# Patient Record
Sex: Female | Born: 1985 | Race: White | Hispanic: No | Smoking: Former smoker
Health system: Southern US, Community
[De-identification: ages and names within clinical notes are randomized; demographics above are authoritative.]

## PROBLEM LIST (undated history)

## (undated) DIAGNOSIS — T7840XA Allergy, unspecified, initial encounter: Secondary | ICD-10-CM

## (undated) DIAGNOSIS — G4700 Insomnia, unspecified: Secondary | ICD-10-CM

## (undated) DIAGNOSIS — F411 Generalized anxiety disorder: Secondary | ICD-10-CM

## (undated) DIAGNOSIS — J45909 Unspecified asthma, uncomplicated: Secondary | ICD-10-CM

## (undated) DIAGNOSIS — F419 Anxiety disorder, unspecified: Secondary | ICD-10-CM

## (undated) DIAGNOSIS — Z8659 Personal history of other mental and behavioral disorders: Secondary | ICD-10-CM

## (undated) DIAGNOSIS — H04129 Dry eye syndrome of unspecified lacrimal gland: Secondary | ICD-10-CM

## (undated) DIAGNOSIS — L940 Localized scleroderma [morphea]: Secondary | ICD-10-CM

## (undated) DIAGNOSIS — Z973 Presence of spectacles and contact lenses: Secondary | ICD-10-CM

## (undated) DIAGNOSIS — Z8709 Personal history of other diseases of the respiratory system: Secondary | ICD-10-CM

## (undated) DIAGNOSIS — J302 Other seasonal allergic rhinitis: Secondary | ICD-10-CM

## (undated) DIAGNOSIS — B009 Herpesviral infection, unspecified: Secondary | ICD-10-CM

## (undated) DIAGNOSIS — Z309 Encounter for contraceptive management, unspecified: Secondary | ICD-10-CM

## (undated) HISTORY — DX: Insomnia, unspecified: G47.00

## (undated) HISTORY — DX: Herpesviral infection, unspecified: B00.9

## (undated) HISTORY — DX: Encounter for contraceptive management, unspecified: Z30.9

## (undated) HISTORY — DX: Unspecified asthma, uncomplicated: J45.909

## (undated) HISTORY — PX: NASAL SINUS SURGERY: SHX719

## (undated) HISTORY — DX: Allergy, unspecified, initial encounter: T78.40XA

---
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
## 2008-12-29 ENCOUNTER — Other Ambulatory Visit: Admission: RE | Admit: 2008-12-29 | Discharge: 2008-12-29 | Payer: Self-pay | Admitting: Obstetrics and Gynecology

## 2010-02-07 ENCOUNTER — Emergency Department (HOSPITAL_COMMUNITY)
Admission: EM | Admit: 2010-02-07 | Discharge: 2010-02-07 | Payer: Self-pay | Source: Home / Self Care | Admitting: Emergency Medicine

## 2010-02-09 ENCOUNTER — Ambulatory Visit (HOSPITAL_COMMUNITY)
Admission: RE | Admit: 2010-02-09 | Discharge: 2010-02-09 | Payer: Self-pay | Source: Home / Self Care | Admitting: Family Medicine

## 2010-04-01 ENCOUNTER — Encounter: Payer: Self-pay | Admitting: Internal Medicine

## 2010-04-19 ENCOUNTER — Other Ambulatory Visit: Payer: Self-pay | Admitting: Adult Health

## 2010-04-19 ENCOUNTER — Other Ambulatory Visit (HOSPITAL_COMMUNITY)
Admission: RE | Admit: 2010-04-19 | Discharge: 2010-04-19 | Disposition: A | Discharge status: Home / Self Care | Payer: BC Managed Care – PPO | Source: Ambulatory Visit | Attending: Obstetrics and Gynecology | Admitting: Obstetrics and Gynecology

## 2010-04-19 DIAGNOSIS — Z113 Encounter for screening for infections with a predominantly sexual mode of transmission: Secondary | ICD-10-CM | POA: Insufficient documentation

## 2010-04-19 DIAGNOSIS — Z01419 Encounter for gynecological examination (general) (routine) without abnormal findings: Secondary | ICD-10-CM | POA: Insufficient documentation

## 2010-05-22 LAB — CBC
HCT: 38 % (ref 36.0–46.0)
Hemoglobin: 13.9 g/dL (ref 12.0–15.0)
MCH: 32.3 pg (ref 26.0–34.0)
MCHC: 36.6 g/dL — ABNORMAL HIGH (ref 30.0–36.0)
Platelets: 271 10*3/uL (ref 150–400)
RBC: 4.3 MIL/uL (ref 3.87–5.11)
RDW: 11.6 % (ref 11.5–15.5)

## 2010-05-22 LAB — BASIC METABOLIC PANEL
Calcium: 9.3 mg/dL (ref 8.4–10.5)
GFR calc Af Amer: >60 mL/min (ref >60)
GFR calc non Af Amer: >60 mL/min (ref >60)

## 2010-05-22 LAB — DIFFERENTIAL
Basophils Relative: 0 % (ref 0–1)
Eosinophils Relative: 1 % (ref 0–5)
Lymphocytes Relative: 17 % (ref 12–46)
Monocytes Absolute: 0.4 10*3/uL (ref 0.1–1.0)
Monocytes Relative: 5 % (ref 3–12)
Neutro Abs: 6.6 10*3/uL (ref 1.7–7.7)

## 2010-07-24 NOTE — Assessment & Plan Note (Signed)
[facility]                                 ON-CALL NOTE   NAME:YOUNGAngelys, Yetman                         MRN:          [id number]  DATE:11/23/2006                            DOB:          Jul 14, 1985    ON CALL NOTE   DATE OF CALL:  November 23, 2006, time 9:30 A.M.   PROBLEM:  The patient's father, Sibley Rolison, phone number [phone],  called to report that Dhalia had a recent upper respiratory tract  infection, started on a Z-Pak and steroid taper in the middle of the  past week.  Cough getting worse, barking, not really wheezy.  No phlegm,  no fever.  The patient is insisting on going to work and father is  seeking reassurance.  She had dropped off Advair while on her steroid  taper.   IMPRESSION:  Upper respiratory tract infection with bronchitis and  possibly some asthma.   PLAN:  1. Restart Advair which had been on hold, one puff b.i.d.  2. Finish Medrol taper.  3. Fluids and rest.  4. Call office for work-in evaluation if she fails to clear in the      next day or two, otherwise keep scheduled appointment.     Clinton D. Maple Hudson, MD, Tonny Bollman, FACP  Electronically Signed    CDY/MedQ  DD: 11/24/2006  DT: 11/25/2006  Job #: [id number]

## 2010-07-24 NOTE — Assessment & Plan Note (Signed)
[place] [facility]                             PULMONARY OFFICE NOTE   NAME:Pruitt, Heather FAXON                         MRN:          [id number]  DATE:11/24/2006                            DOB:          Oct 29, 1985    HISTORY OF PRESENT ILLNESS:  The patient is a 25 year old female patient  of Dr.  Roxy Cedar  who has a history of allergic asthma that was seen for  pulmonary consult on May 1 of this year. The patient had previously been  followed at [facility] pediatric pulmonary group. The patient presents  today for an acute office visit. The patient complains over the last  week that she has had nasal congestion, post-nasal drip, minimally  productive cough and wheezing. The patient had been called in a Z-Pak  and Medrol dose pack which she will finish tomorrow. The symptoms have  improved. The patient does not use her albuterol recently due to  associated palpitations. However, complains that she has had some  tightness. The patient also had previously been on Advair and it is  unclear of the exact dose that she was taking. However, had recently  stopped this according to her father. There had been some concern over  side effect profile of Advair. Also, it is actually unclear exactly what  medication she is taking because last visit the patient had been  recommended to take Intal plus Singulair which she is currently not on.  The patient denies any hemoptysis, orthopnea or PND or leg swelling. The  patient does not do routine peak flows. The patient's records are not  available from [facility] at this time.   PAST MEDICAL HISTORY:  Reviewed.   CURRENT MEDICATIONS:  Reviewed.   PHYSICAL EXAMINATION:  The patient is pleasant well-developed, well-  nourished female in no acute distress. She is afebrile with stable vital  signs. O2 saturation is 99% on room air.  HEENT: Nasal mucosa is slightly pale. Nontender sinuses. Conjunctivae  were not injected. Tympanic  membranes are normal. The patient's  oropharynx is clear.  NECK: Supple without cervical adenopathy. No JVD.  LUNGS: Lung sounds are clear to auscultation bilaterally without any  wheezing or crackles. diminished in the bases, otherwise, clear.  CARDIAC: S1, S2 without murmur, rub or gallop.  ABDOMEN: Soft, nontender and no palpable hepatosplenomegaly.  EXTREMITIES: Warm without edema.   DATA REVIEWED:  In office spirometry revealed an FEV1 of 4.01 liters  which is 119% of the predicted.   IMPRESSION/PLAN:  Acute upper respiratory infection with a mild  asthmatic flare. The patient appears to be improved at this time. No  further antibiotics or steroids appear to be needed. The patient was  offered a Xopenex nebulizer treatment. However, she declined at this  time. She has been recommended to use Mucinex twice daily as needed and  use Claritin for any post-nasal drip symptoms. I have recommended that  the patient has restarted Advair 250/50 and she may maintain on this. We  will recheck her in one month with Dr.  Maple Hudson at that time and will need  a  full set of PFTs to establish her lung function baseline. Also, the  patient's father has agreed to get the patient's records from [facility].      Rubye Oaks, NP  Electronically Signed      Clinton D. Maple Hudson, MD, Tonny Bollman, FACP  Electronically Signed   TP/MedQ  DD: 11/24/2006  DT: 11/24/2006  Job #: [id number]

## 2010-07-26 ENCOUNTER — Other Ambulatory Visit: Payer: Self-pay | Admitting: Obstetrics & Gynecology

## 2010-07-26 DIAGNOSIS — N63 Unspecified lump in unspecified breast: Secondary | ICD-10-CM

## 2010-07-27 NOTE — Assessment & Plan Note (Signed)
[place] [facility]                             PULMONARY OFFICE NOTE   NAME:Heather Pruitt, Heather Pruitt                         MRN:          [id number]  DATE:07/10/2006                            DOB:          1986/01/30    PROBLEM:  A 25 year old self-referred and seen with mother to establish  for management of allergic asthma.   HISTORY OF PRESENT ILLNESS:  She had been followed as a child through  the pediatric pulmonary group at [facility] but has aged out. She has had  asthma since [place] childhood. Never hospitalized. Has tried Singulair.  They describe mostly a dry, croupy cough and apparently, this is  considered to be cough equivalent asthma. Mother is concerned about the  safety of Advair 250/50 versus 100/50 and we had a long talk about these  and available medications, inhaled steroids, and long acting beta  adrenergic drugs.   MEDICATIONS:  1. Advair 250/50 and also has 100/50.  2. Claritin use p.r.n.  3. Albuterol rescue inhaler, rarely needed.  4. Albuterol 2 mg tabs, using 1/2 b.i.d. p.r.n.   ALLERGIES:  NO KNOWN DRUG ALLERGIES.   REVIEW OF SYSTEMS:  Occasional episodes of cough with little congestion  or wheeze, headaches, itching and sneezing. She had mononucleosis in  February and a gastroenteritis attributed to food poisoning in November  of 2007, which they say caused some weight loss. She does not seem to  recognize heartburn, palpitations, bloody or purulent sputum, fevers, or  sweats.   PAST MEDICAL HISTORY:  Wisdom teeth extraction, sinus surgery, which may  have been a septoplasty in 1998. Asthma. No history with problems with  ears or any rashes, food allergy, or unusual sting reactions. The last  prednisone was almost 2 years ago. She has not been exposed to aspirin  and denies problems with Latex or contrast Iodine.   SOCIAL HISTORY:  Never smoked. Single. Working part-time as a Development worker, international aid. Living with parents. Going to school at  [employer].   ENVIRONMENTAL:  They live in an older house with central air  conditioning, electric baseboard heat, window unit air conditioning. No  feathers. No indoor pets. No significant mold or dust problem  recognized.   FAMILY HISTORY:  Allergies and asthma in the parents. Heart disease on  mother's side.   OBJECTIVE:  VITAL SIGNS:  Weight 131 pounds. Blood pressure 100/52,  pulse 81, room air saturation 100%.  LUNGS:  Currently clear without cough.  HEENT:  Nose and throat clear.  HEART:  Sounds regular without murmur or gallop.  EXTREMITIES:  No clubbing, cyanosis, or edema.   IMPRESSION:  Mild cough equivalent asthma. Usually Advair 100 is  sufficient but occasional she needs to move up to the 250/50 with very  rare need for albuterol rescue inhaler. Mother's main concern was about  medication safety. Educational discussion was done as above.   PLAN:  Now in the summer, try without Advair. Instead, try Intal 2 puffs  q.i.d. plus Singulair 10 mg. These medications were discussed. Schedule  return in 2 months,  earlier p.r.n.     Clinton D. Maple Hudson, MD, Tonny Bollman, FACP  Electronically Signed    CDY/MedQ  DD: 07/12/2006  DT: 07/12/2006  Job #: [id number]

## 2010-08-01 ENCOUNTER — Ambulatory Visit (HOSPITAL_COMMUNITY)
Admission: RE | Admit: 2010-08-01 | Discharge: 2010-08-01 | Disposition: A | Discharge status: Home / Self Care | Payer: BC Managed Care – PPO | Source: Ambulatory Visit | Attending: Obstetrics & Gynecology | Admitting: Obstetrics & Gynecology

## 2010-08-01 DIAGNOSIS — N63 Unspecified lump in unspecified breast: Secondary | ICD-10-CM

## 2011-04-19 ENCOUNTER — Other Ambulatory Visit (HOSPITAL_COMMUNITY)
Admission: RE | Admit: 2011-04-19 | Discharge: 2011-04-19 | Disposition: A | Discharge status: Home / Self Care | Payer: BC Managed Care – PPO | Source: Ambulatory Visit | Attending: Obstetrics and Gynecology | Admitting: Obstetrics and Gynecology

## 2011-04-19 ENCOUNTER — Other Ambulatory Visit: Payer: Self-pay | Admitting: Adult Health

## 2011-04-19 DIAGNOSIS — Z01419 Encounter for gynecological examination (general) (routine) without abnormal findings: Secondary | ICD-10-CM | POA: Insufficient documentation

## 2011-06-29 ENCOUNTER — Encounter (HOSPITAL_COMMUNITY): Payer: Self-pay | Admitting: *Deleted

## 2011-06-29 ENCOUNTER — Emergency Department (HOSPITAL_COMMUNITY)
Admission: EM | Admit: 2011-06-29 | Discharge: 2011-06-29 | Disposition: A | Discharge status: Home / Self Care | Payer: BC Managed Care – PPO | Attending: Emergency Medicine | Admitting: Emergency Medicine

## 2011-06-29 DIAGNOSIS — F411 Generalized anxiety disorder: Secondary | ICD-10-CM | POA: Insufficient documentation

## 2011-06-29 DIAGNOSIS — R112 Nausea with vomiting, unspecified: Secondary | ICD-10-CM | POA: Insufficient documentation

## 2011-06-29 DIAGNOSIS — K529 Noninfective gastroenteritis and colitis, unspecified: Secondary | ICD-10-CM

## 2011-06-29 DIAGNOSIS — K5289 Other specified noninfective gastroenteritis and colitis: Secondary | ICD-10-CM | POA: Insufficient documentation

## 2011-06-29 DIAGNOSIS — Z79899 Other long term (current) drug therapy: Secondary | ICD-10-CM | POA: Insufficient documentation

## 2011-06-29 DIAGNOSIS — R109 Unspecified abdominal pain: Secondary | ICD-10-CM | POA: Insufficient documentation

## 2011-06-29 HISTORY — DX: Anxiety disorder, unspecified: F41.9

## 2011-06-29 MED ORDER — ONDANSETRON HCL 4 MG/2ML IJ SOLN: 4.0000 mg | Freq: Once | INTRAMUSCULAR | Status: DC

## 2011-06-29 MED ORDER — OXYCODONE-ACETAMINOPHEN 5-325 MG PO TABS: 1.0000 | ORAL_TABLET | Freq: Four times a day (QID) | ORAL | Status: AC | PRN

## 2011-06-29 MED ORDER — ONDANSETRON HCL 8 MG PO TABS: 8.0000 mg | ORAL_TABLET | ORAL | Status: AC | PRN

## 2011-06-29 MED ORDER — SODIUM CHLORIDE 0.9 % IV BOLUS (SEPSIS): 1000.0000 mL | Freq: Once | INTRAVENOUS | Status: DC

## 2011-06-29 MED ORDER — SODIUM CHLORIDE 0.9 % IV SOLN
Freq: Once | INTRAVENOUS | Status: AC
  Administered 2011-06-29: 16:00:00 via INTRAVENOUS

## 2011-06-29 MED ORDER — ONDANSETRON HCL 4 MG/2ML IJ SOLN
4.0000 mg | Freq: Once | INTRAMUSCULAR | Status: AC
  Administered 2011-06-29: 4 mg via INTRAVENOUS
  Filled 2011-06-29: qty 2

## 2011-06-29 MED ORDER — SODIUM CHLORIDE 0.9 % IV BOLUS (SEPSIS)
1000.0000 mL | Freq: Once | INTRAVENOUS | Status: AC
  Administered 2011-06-29: 1000 mL via INTRAVENOUS

## 2011-06-29 NOTE — ED Notes (Signed)
Pt stated she was feeling much better after first liter of fluids.  Family at bedside.

## 2011-06-29 NOTE — Discharge Instructions (Signed)
Clear liquids tonight. Medications for pain and nausea. Return if worse

## 2011-06-29 NOTE — ED Notes (Signed)
Pt presents with N/V/D and stomach cramps x 2 days. Pt is pallor in color at this time.

## 2011-06-29 NOTE — ED Provider Notes (Signed)
History     CSN: [id number]  Arrival date & time 06/29/11  1407   First MD Initiated Contact with Patient 06/29/11 1512      Chief Complaint  Patient presents with  . Emesis  . Abdominal Pain    (Consider location/radiation/quality/duration/timing/severity/associated sxs/prior treatment) HPI...nausea, vomiting, diarrhea, for 24 hours. Also complains of abdominal cramping. She was exposed to some sick children. No fever, chills, dysuria. Feels dehydrated. Nothing makes it better or worse. Symptoms are moderate.  Past Medical History  Diagnosis Date  . Anxiety     Past Surgical History  Procedure Date  . Nasal sinus surgery     No family history on file.  History  Substance Use Topics  . Smoking status: Former Smoker    Types: Cigarettes  . Smokeless tobacco: Not on file  . Alcohol Use: Yes     occassionally    OB History    Grav Para Term Preterm Abortions TAB SAB Ect Mult Living                  Review of Systems  All other systems reviewed and are negative.    Allergies  Review of patient's allergies indicates no known allergies.  Home Medications   Current Outpatient Rx  Name Route Sig Dispense Refill  . VITAMIN D 2000 UNITS PO TABS Oral Take 2,000 Units by mouth daily.    [name] ONE-DAILY MULTI VITAMINS PO TABS Oral Take 1 tablet by mouth daily.    Azzie Roup ACE-ETH ESTRAD-FE 1.5-30 MG-MCG PO TABS Oral Take 1 tablet by mouth daily.    . VENLAFAXINE HCL ER 75 MG PO CP24 Oral Take 75 mg by mouth daily.    [name] ONDANSETRON HCL 8 MG PO TABS Oral Take 1 tablet (8 mg total) by mouth every 4 (four) hours as needed for nausea. 8 tablet 0  . OXYCODONE-ACETAMINOPHEN 5-325 MG PO TABS Oral Take 1-2 tablets by mouth every 6 (six) hours as needed for pain. 15 tablet 0    BP 101/57  Pulse 98  Temp(Src) 98.1 F (36.7 C) (Oral)  Resp 18  Ht 5\' 4"  (1.626 m)  Wt 138 lb (62.596 kg)  BMI 23.69 kg/m2  SpO2 100%  LMP 05/29/2011  Physical Exam  Nursing note and vitals  reviewed. Constitutional: She is oriented to person, place, and time. She appears well-developed and well-nourished.  HENT:  Head: Normocephalic and atraumatic.  Eyes: Conjunctivae and EOM are normal. Pupils are equal, round, and reactive to light.  Neck: Normal range of motion. Neck supple.  Cardiovascular: Normal rate and regular rhythm.   Pulmonary/Chest: Effort normal and breath sounds normal.  Abdominal: Soft. Bowel sounds are normal.  Musculoskeletal: Normal range of motion.  Neurological: She is alert and oriented to person, place, and time.  Skin: Skin is warm and dry.  Psychiatric: She has a normal mood and affect.    ED Course  Procedures (including critical care time)  Labs Reviewed - No data to display No results found.   1. Gastroenteritis       MDM  IV hydration, Zofran.  Recheck at 1715: Feeling much better. Good color. No acute abdomen      Donnetta Hutching, MD 06/29/11 1727

## 2011-06-29 NOTE — ED Notes (Signed)
Pt reports vomiting x1, 2 days ago, has since been having nausea and ab cramping.   Color is pale.  +diarrhea. No fever

## 2012-01-15 IMAGING — CR DG CERVICAL SPINE COMPLETE 4+V
6 series · 6 of 6 positions shown · non-contrast
Comparison: None.

CLINICAL DATA: Syncope

CERVICAL SPINE - COMPLETE 4+ VIEW

[view not recorded (1 of 6)]
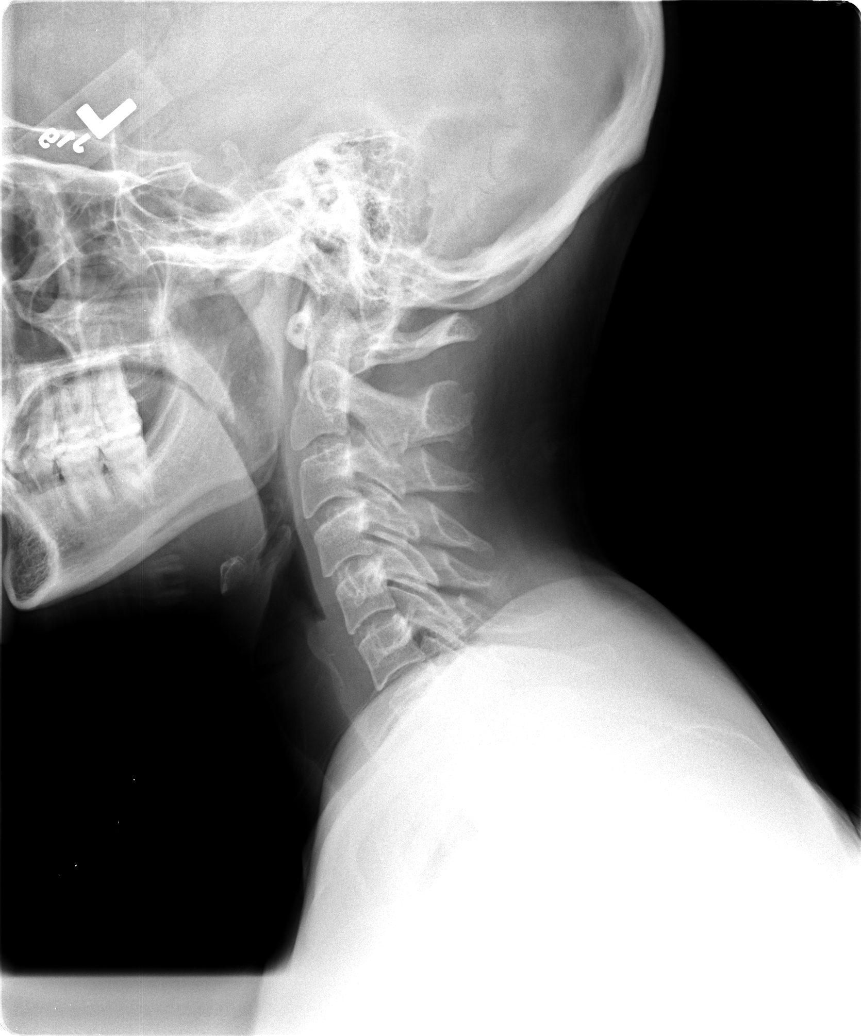

[view not recorded (2 of 6)]
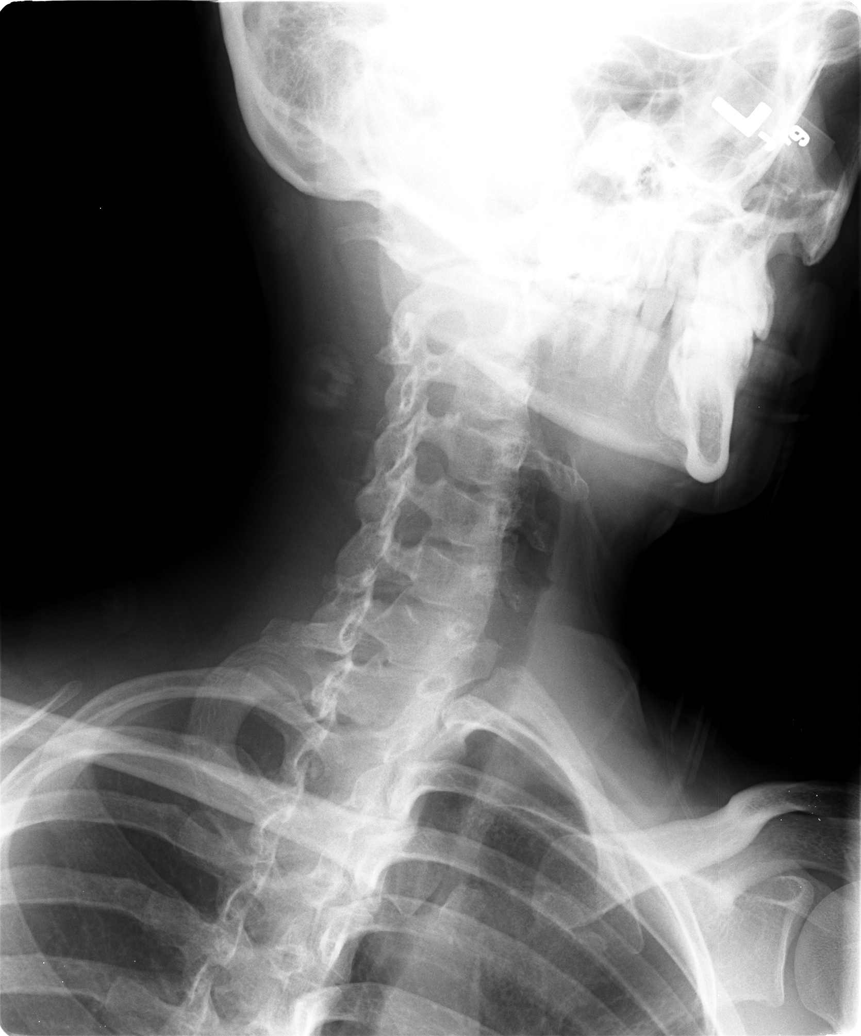

[view not recorded (3 of 6)]
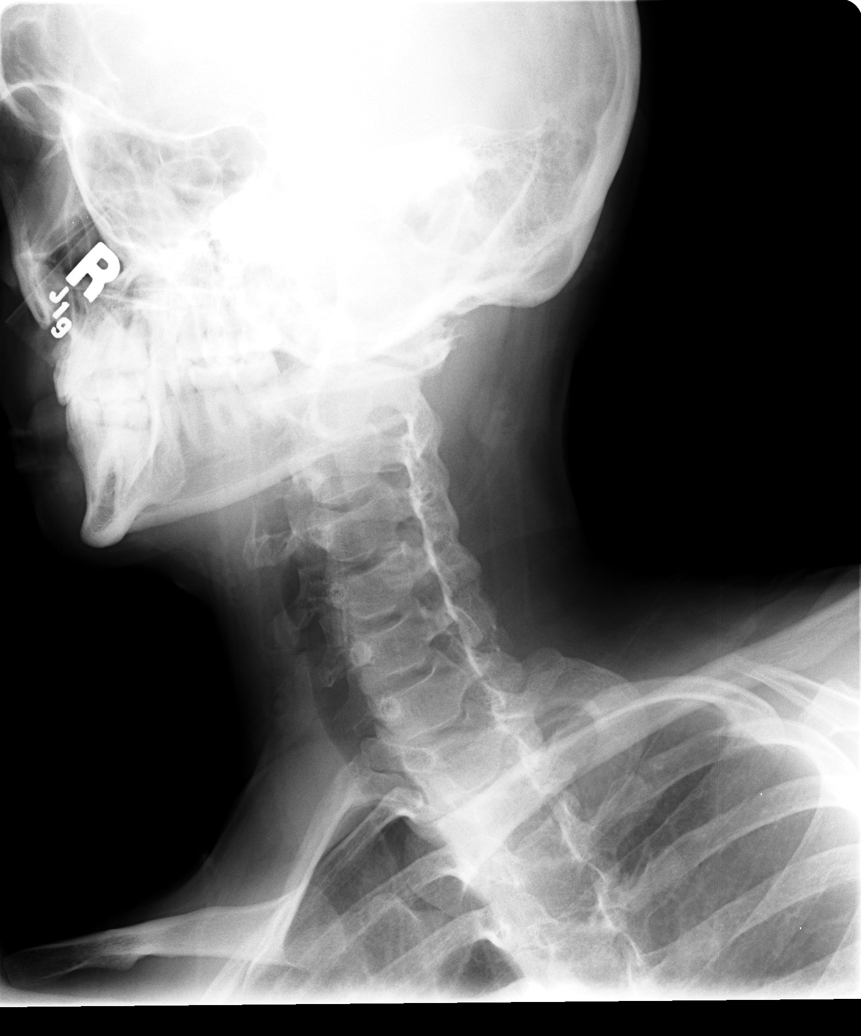

[view not recorded (4 of 6)]
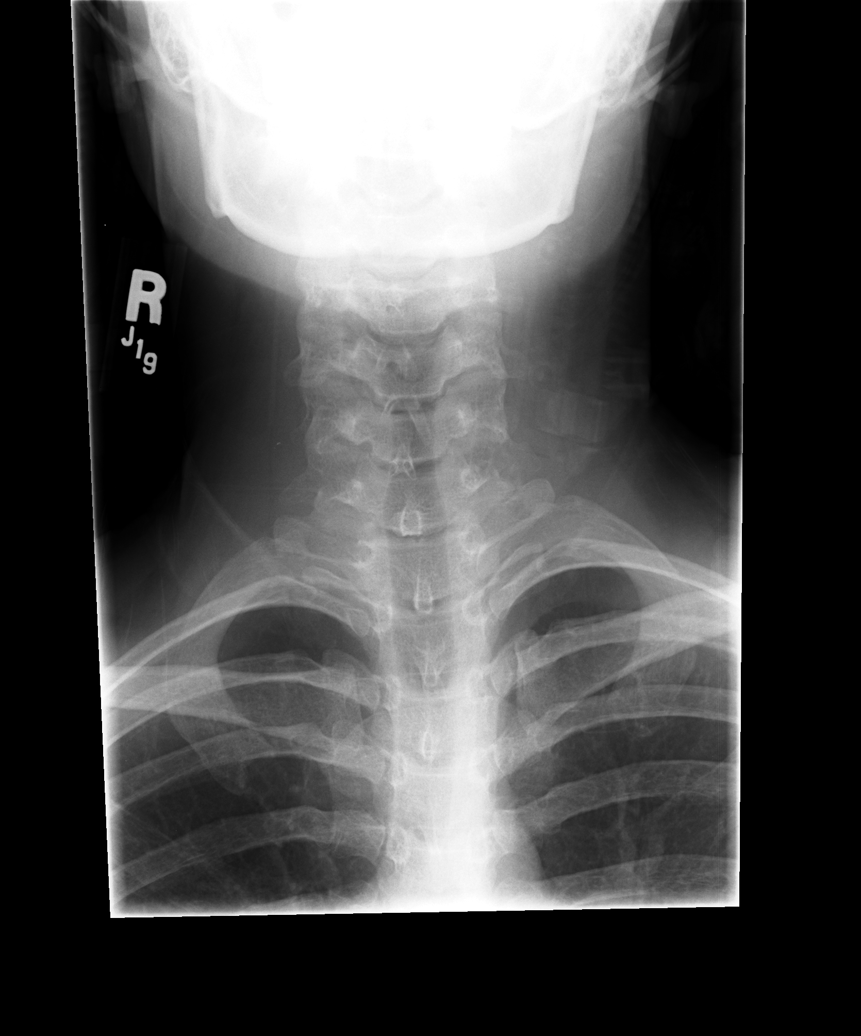

[view not recorded (5 of 6)]
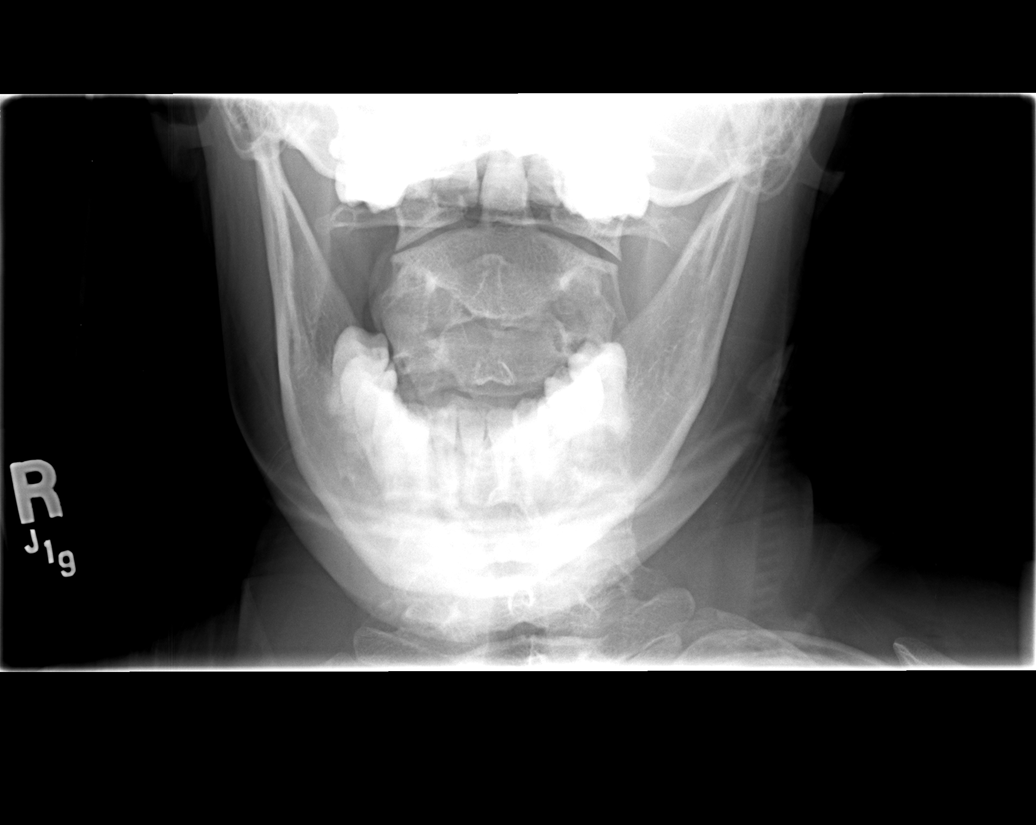

[view not recorded (6 of 6)]
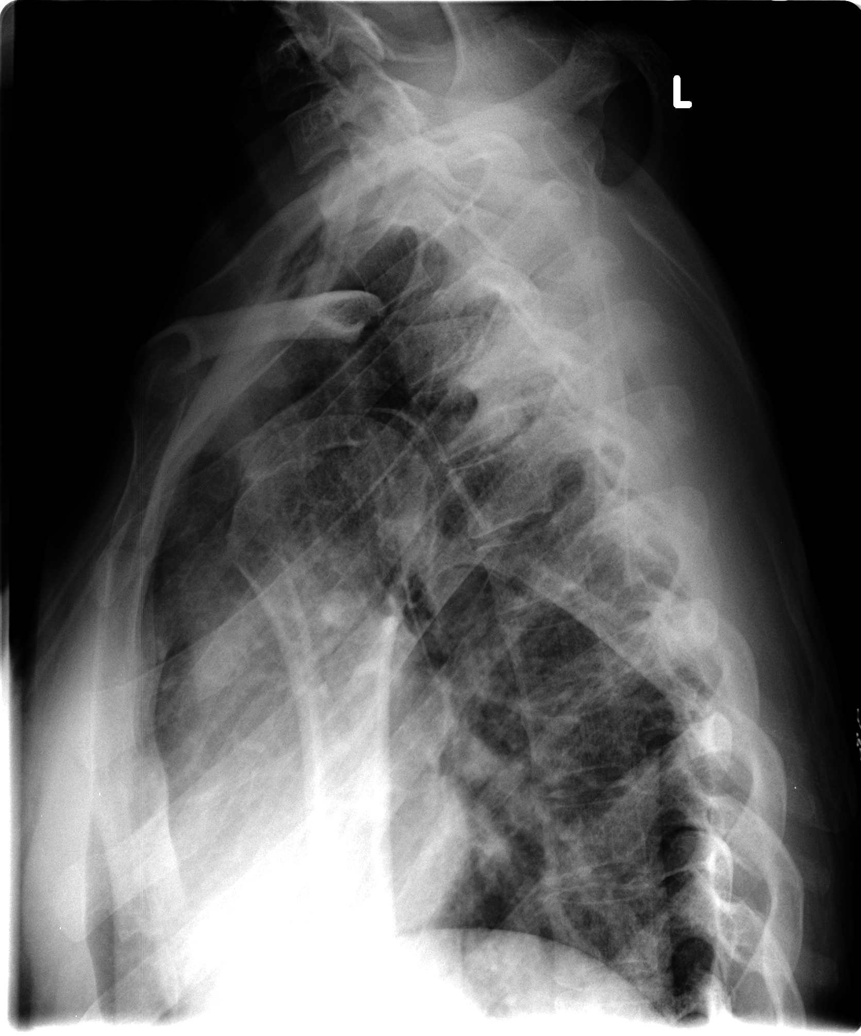

[6 of 6 positions shown; findings below may reference images not displayed]

FINDINGS: The cervical vertebrae are in normal alignment with
normal intervertebral disc spaces.  No compression deformity is
seen.  No prevertebral soft tissue swelling is noted.  On oblique
views the foramina are patent.  The odontoid process is intact.
IMPRESSION: Normal alignment with normal disc spaces.

## 2012-07-28 ENCOUNTER — Telehealth: Payer: Self-pay | Admitting: Adult Health

## 2012-07-28 NOTE — Telephone Encounter (Signed)
No mailbox 

## 2012-07-29 NOTE — Telephone Encounter (Signed)
Has no voice mail

## 2012-07-30 NOTE — Telephone Encounter (Signed)
Ok to skip "dummy" pills and start new pack to skip period while at the beach

## 2013-04-19 ENCOUNTER — Encounter (INDEPENDENT_AMBULATORY_CARE_PROVIDER_SITE_OTHER): Payer: Self-pay

## 2013-04-19 ENCOUNTER — Other Ambulatory Visit (HOSPITAL_COMMUNITY)
Admission: RE | Admit: 2013-04-19 | Discharge: 2013-04-19 | Disposition: A | Discharge status: Home / Self Care | Payer: BC Managed Care – PPO | Source: Ambulatory Visit | Attending: Obstetrics and Gynecology | Admitting: Obstetrics and Gynecology

## 2013-04-19 ENCOUNTER — Ambulatory Visit (INDEPENDENT_AMBULATORY_CARE_PROVIDER_SITE_OTHER): Payer: BC Managed Care – PPO | Admitting: Adult Health

## 2013-04-19 ENCOUNTER — Encounter: Payer: Self-pay | Admitting: Adult Health

## 2013-04-19 VITALS — BP 110/70 | HR 74 | Ht 64.0 in | Wt 144.0 lb

## 2013-04-19 DIAGNOSIS — Z309 Encounter for contraceptive management, unspecified: Secondary | ICD-10-CM | POA: Insufficient documentation

## 2013-04-19 DIAGNOSIS — Z01419 Encounter for gynecological examination (general) (routine) without abnormal findings: Secondary | ICD-10-CM | POA: Insufficient documentation

## 2013-04-19 HISTORY — DX: Encounter for contraceptive management, unspecified: Z30.9

## 2013-04-19 MED ORDER — NORGESTIMATE-ETH ESTRADIOL 0.25-35 MG-MCG PO TABS: 1.0000 | ORAL_TABLET | Freq: Every day | ORAL | Status: DC

## 2013-04-19 NOTE — Patient Instructions (Signed)
Physical in 2 years Continue oCs  Call prn

## 2013-04-19 NOTE — Progress Notes (Signed)
Patient ID: Heather Pruitt, female   DOB: [birth year], 28 y.o.   MRN: [id number] History of Present Illness: Heather Pruitt is a 28 year old white female, single, in for a pap and physical.No complaints.   Current Medications, Allergies, Past Medical History, Past Surgical History, Family History and Social History were reviewed in [facility] electronic medical record.     Review of Systems: Patient denies any headaches, blurred vision, shortness of breath, chest pain, abdominal pain, problems with bowel movements, urination, or intercourse. No joint pain or mood swings, is happy with her pills.    Physical Exam:BP 110/70  Pulse 74  Ht 5\' 4"  (1.626 m)  Wt 144 lb (65.318 kg)  BMI 24.71 kg/m2  LMP 04/11/2013 General:  Well developed, well nourished, no acute distress Skin:  Warm and dry Neck:  Midline trachea, normal thyroid Lungs; Clear to auscultation bilaterally Breast:  No dominant palpable mass, retraction, or nipple discharge Cardiovascular: Regular rate and rhythm Abdomen:  Soft, non tender, no hepatosplenomegaly Pelvic:  External genitalia is normal in appearance.  The vagina is normal in appearance.  The cervix is nulliparous, pap performed.  Uterus is felt to be normal size, shape, and contour.  No adnexal masses or tenderness noted. Extremities:  No swelling or varicosities noted Psych:  No mood changes, alert and cooperative, seems happy   Impression: Yearly gyn exam Contraceptive management    Plan: Refilled sprintec x 1 year Return in 2 years for pap and physical prn problems

## 2014-03-01 ENCOUNTER — Other Ambulatory Visit: Payer: Self-pay | Admitting: Adult Health

## 2015-01-12 ENCOUNTER — Other Ambulatory Visit: Payer: Self-pay | Admitting: Adult Health

## 2015-04-10 ENCOUNTER — Other Ambulatory Visit (HOSPITAL_COMMUNITY)
Admission: RE | Admit: 2015-04-10 | Discharge: 2015-04-10 | Disposition: A | Discharge status: Home / Self Care | Payer: 59 | Source: Ambulatory Visit | Attending: Obstetrics & Gynecology | Admitting: Obstetrics & Gynecology

## 2015-04-10 ENCOUNTER — Ambulatory Visit (INDEPENDENT_AMBULATORY_CARE_PROVIDER_SITE_OTHER): Payer: 59 | Admitting: Adult Health

## 2015-04-10 ENCOUNTER — Encounter: Payer: Self-pay | Admitting: Adult Health

## 2015-04-10 VITALS — BP 124/82 | HR 70 | Ht 64.25 in | Wt 142.5 lb

## 2015-04-10 DIAGNOSIS — Z01419 Encounter for gynecological examination (general) (routine) without abnormal findings: Secondary | ICD-10-CM

## 2015-04-10 DIAGNOSIS — Z3041 Encounter for surveillance of contraceptive pills: Secondary | ICD-10-CM

## 2015-04-10 MED ORDER — NORGESTIMATE-ETH ESTRADIOL 0.25-35 MG-MCG PO TABS: 1.0000 | ORAL_TABLET | Freq: Every day | ORAL | Status: DC

## 2015-04-10 NOTE — Patient Instructions (Signed)
Pap and physical in 2 years  Use condoms

## 2015-04-10 NOTE — Progress Notes (Signed)
Patient ID: LOUAN BASE, female   DOB: [birth year], 30 y.o.   MRN: [id number] History of Present Illness: Talecia is a 30 year old white female, single in 10 year relationship, in for well woman gyn exam and pap and refills OCs, she is happy with her pills PCP is Boneta Lucks at [facility].   Current Medications, Allergies, Past Medical History, Past Surgical History, Family History and Social History were reviewed in [facility] record.     Review of Systems: Patient denies any headaches, hearing loss, fatigue, blurred vision, shortness of breath, chest pain, abdominal pain, problems with bowel movements, urination, or intercourse. No joint pain or mood swings.    Physical Exam:BP 124/82 mmHg  Pulse 70  Ht 5' 4.25" (1.632 m)  Wt 142 lb 8 oz (64.638 kg)  BMI 24.27 kg/m2  LMP 03/20/2015 (Approximate) General:  Well developed, well nourished, no acute distress Skin:  Warm and dry Neck:  Midline trachea, normal thyroid, good ROM, no lymphadenopathy Lungs; Clear to auscultation bilaterally Breast:  No dominant palpable mass, retraction, or nipple discharge Cardiovascular: Regular rate and rhythm Abdomen:  Soft, non tender, no hepatosplenomegaly Pelvic:  External genitalia is normal in appearance, no lesions.  The vagina is normal in appearance. Urethra has no lesions or masses. The cervix is everted at os, friable with EC brush, pap performed.[name]  Uterus is felt to be normal size, shape, and contour.  No adnexal masses or tenderness noted.Bladder is non tender, no masses felt. Extremities/musculoskeletal:  No swelling or varicosities noted, no clubbing or cyanosis Psych:  No mood changes, alert and cooperative,seems happy She declines labs or STD testing.  Impression: Well woman gyn exam and pap Contraceptive management    Plan: Refilled sprintec x 1 year Pap and physical in 1 year Follow up prn problems

## 2015-04-11 LAB — CYTOLOGY - PAP

## 2016-01-01 ENCOUNTER — Other Ambulatory Visit: Payer: Self-pay | Admitting: *Deleted

## 2016-01-01 MED ORDER — NORGESTIMATE-ETH ESTRADIOL 0.25-35 MG-MCG PO TABS: 1.0000 | ORAL_TABLET | Freq: Every day | ORAL | 12 refills | Status: DC

## 2016-03-11 DIAGNOSIS — Z8619 Personal history of other infectious and parasitic diseases: Secondary | ICD-10-CM

## 2016-03-11 HISTORY — DX: Personal history of other infectious and parasitic diseases: Z86.19

## 2016-08-13 ENCOUNTER — Ambulatory Visit (INDEPENDENT_AMBULATORY_CARE_PROVIDER_SITE_OTHER): Payer: 59 | Admitting: Physician Assistant

## 2016-08-13 ENCOUNTER — Encounter (INDEPENDENT_AMBULATORY_CARE_PROVIDER_SITE_OTHER): Payer: Self-pay

## 2016-08-13 ENCOUNTER — Encounter: Payer: Self-pay | Admitting: Physician Assistant

## 2016-08-13 VITALS — BP 110/70 | HR 88 | Temp 98.2°F | Ht 64.0 in | Wt 132.0 lb

## 2016-08-13 DIAGNOSIS — F411 Generalized anxiety disorder: Secondary | ICD-10-CM

## 2016-08-13 DIAGNOSIS — R110 Nausea: Secondary | ICD-10-CM

## 2016-08-13 DIAGNOSIS — K2100 Gastro-esophageal reflux disease with esophagitis, without bleeding: Secondary | ICD-10-CM

## 2016-08-13 DIAGNOSIS — R197 Diarrhea, unspecified: Secondary | ICD-10-CM | POA: Diagnosis not present

## 2016-08-13 DIAGNOSIS — Z8619 Personal history of other infectious and parasitic diseases: Secondary | ICD-10-CM | POA: Diagnosis not present

## 2016-08-13 MED ORDER — ONDANSETRON HCL 4 MG PO TABS: 4.0000 mg | ORAL_TABLET | Freq: Three times a day (TID) | ORAL | 0 refills | Status: DC | PRN

## 2016-08-13 MED ORDER — VENLAFAXINE HCL ER 75 MG PO CP24: 75.0000 mg | ORAL_CAPSULE | Freq: Every day | ORAL | 5 refills | Status: DC

## 2016-08-13 NOTE — Patient Instructions (Signed)
If still having abdominal pain and diarrhea consider turning in stool samples and getting h.pylori.   Bland Diet A bland diet consists of foods that do not have a lot of fat or fiber. Foods without fat or fiber are easier for the body to digest. They are also less likely to irritate your mouth, throat, stomach, and other parts of your gastrointestinal tract. A bland diet is sometimes called a BRAT diet. What is my plan? Your health care provider or dietitian may recommend specific changes to your diet to prevent and treat your symptoms, such as:  Eating small meals often.  Cooking food until it is soft enough to chew easily.  Chewing your food well.  Drinking fluids slowly.  Not eating foods that are very spicy, sour, or fatty.  Not eating citrus fruits, such as oranges and grapefruit.  What do I need to know about this diet?  Eat a variety of foods from the bland diet food list.  Do not follow a bland diet longer than you have to.  Ask your health care provider whether you should take vitamins. What foods can I eat? Grains  Hot cereals, such as cream of wheat. Bread, crackers, or tortillas made from refined white flour. Rice. Vegetables Canned or cooked vegetables. Mashed or boiled potatoes. Fruits Bananas. Applesauce. Other types of cooked or canned fruit with the skin and seeds removed, such as canned peaches or pears. Meats and Other Protein Sources Scrambled eggs. Creamy peanut butter or other nut butters. Lean, well-cooked meats, such as chicken or fish. Tofu. Soups or broths. Dairy Low-fat dairy products, such as milk, cottage cheese, or yogurt. Beverages Water. Herbal tea. Apple juice. Sweets and Desserts Pudding. Custard. Fruit gelatin. Ice cream. Fats and Oils Mild salad dressings. Canola or olive oil. The items listed above may not be a complete list of allowed foods or beverages. Contact your dietitian for more options. What foods are not recommended? Foods  and ingredients that are often not recommended include:  Spicy foods, such as hot sauce or salsa.  Fried foods.  Sour foods, such as pickled or fermented foods.  Raw vegetables or fruits, especially citrus or berries.  Caffeinated drinks.  Alcohol.  Strongly flavored seasonings or condiments.  The items listed above may not be a complete list of foods and beverages that are not allowed. Contact your dietitian for more information. This information is not intended to replace advice given to you by your health care provider. Make sure you discuss any questions you have with your health care provider. Document Released: 06/19/2015 Document Revised: 08/03/2015 Document Reviewed: 03/09/2014 Elsevier Interactive Patient Education  2018 [employer]

## 2016-08-13 NOTE — Progress Notes (Signed)
Subjective:    Patient ID: Heather Pruitt, female    DOB: 1985/06/16, [age] y.o.   MRN: [id number]  HPI  Pt is a 31 yo female who presents to the clinic to establish care.   Pt used to see psychiatry but she is well controlled on effexor. It has worked well for a while.   She comes in today with a 24 hour hx of diarrhea and nausea. No vomiting or fever. Seems to be better today but she is very nervous due to her past hx of c.diff after clindamycin. She denies any recent abx. She has had some recent worsening of heart burn that tums is not helping. She took a OTC 14 day prevacid pack that she finished on Saturday. It did help. She feels like symptoms may be coming back some.     .. Active Ambulatory Problems    Diagnosis Date Noted  . ALLERGIC RHINITIS 02/17/2006  . ASTHMA 02/17/2006  . CONSTIPATION 02/17/2006  . INFECTION, URINARY TRACT NOS 02/17/2006  . Contraceptive management 04/19/2013  . History of Clostridium difficile infection 08/13/2016  . Gastroesophageal reflux disease with esophagitis 08/14/2016  . Nausea 08/14/2016  . GAD (generalized anxiety disorder) 08/14/2016  . Diarrhea 08/14/2016   Resolved Ambulatory Problems    Diagnosis Date Noted  . No Resolved Ambulatory Problems   Past Medical History:  Diagnosis Date  . Anxiety   . Asthma   . Contraceptive management 04/19/2013     Review of Systems See HPI.     Objective:   Physical Exam  Constitutional: She is oriented to person, place, and time. She appears well-developed and well-nourished.  HENT:  Head: Normocephalic and atraumatic.  Right Ear: External ear normal.  Left Ear: External ear normal.  Eyes: Conjunctivae are normal. Right eye exhibits no discharge. Left eye exhibits no discharge.  Neck: Normal range of motion. Neck supple.  Cardiovascular: Normal rate, regular rhythm and normal heart sounds.   Pulmonary/Chest: Effort normal and breath sounds normal. She has no wheezes.  Abdominal: Soft. Bowel  sounds are normal. She exhibits no distension and no mass. There is tenderness. There is no rebound and no guarding.  Generalized abdominal tenderness to palpation. Worse over RLQ and LUQ to palpation.   Lymphadenopathy:    She has no cervical adenopathy.  Neurological: She is alert and oriented to person, place, and time.  Psychiatric: She has a normal mood and affect. Her behavior is normal.          Assessment & Plan:  [name].[name].Heather Pruitt was seen today for establish care, anxiety and diarrhea.  Diagnoses and all orders for this visit:  Diarrhea, unspecified type -     Stool Culture -     Clostridium difficile culture-fecal  History of Clostridium difficile infection -     Stool Culture -     Clostridium difficile culture-fecal  GAD (generalized anxiety disorder) -     venlafaxine XR (EFFEXOR-XR) 75 MG 24 hr capsule; Take 1 capsule (75 mg total) by mouth daily.  Nausea -     ondansetron (ZOFRAN) 4 MG tablet; Take 1 tablet (4 mg total) by mouth every 8 (eight) hours as needed for nausea or vomiting.  Gastroesophageal reflux disease with esophagitis   .[name]. GAD 7 : Generalized Anxiety Score 08/13/2016  Nervous, Anxious, on Edge 0  Control/stop worrying 0  Worry too much - different things 1  Trouble relaxing 0  Restless 0  Easily annoyed or irritable 2  Afraid -  awful might happen 1  Total GAD 7 Score 4      .. Depression screen PHQ 2/9 08/13/2016  Decreased Interest 0  Down, Depressed, Hopeless 0  PHQ - 2 Score 0  Altered sleeping 0  Tired, decreased energy 1  Change in appetite 0  Feeling bad or failure about yourself  0  Trouble concentrating 1  Moving slowly or fidgety/restless 0  Suicidal thoughts 0  PHQ-9 Score 2   Reassured patient I do not think this is C.diff. Ordered culture if symptoms worsened she could have tested. Likely viral. If acid reflux worsens would like patient to come back for nurse visit in 2 weeks for h.pylori breath test. She did have some LUQ  tenderness on exam. zofran given for nausea. Written out of work for yesterday and today. Call if not improving or symptoms worsening.

## 2016-08-14 ENCOUNTER — Encounter: Payer: Self-pay | Admitting: Physician Assistant

## 2016-08-14 DIAGNOSIS — R197 Diarrhea, unspecified: Secondary | ICD-10-CM | POA: Insufficient documentation

## 2016-08-14 DIAGNOSIS — R110 Nausea: Secondary | ICD-10-CM | POA: Insufficient documentation

## 2016-08-14 DIAGNOSIS — F411 Generalized anxiety disorder: Secondary | ICD-10-CM | POA: Insufficient documentation

## 2016-08-14 DIAGNOSIS — K2100 Gastro-esophageal reflux disease with esophagitis, without bleeding: Secondary | ICD-10-CM | POA: Insufficient documentation

## 2016-09-01 ENCOUNTER — Telehealth: Payer: 59 | Admitting: Family

## 2016-09-01 DIAGNOSIS — J019 Acute sinusitis, unspecified: Secondary | ICD-10-CM

## 2016-09-01 MED ORDER — DOXYCYCLINE HYCLATE 100 MG PO TABS: 100.0000 mg | ORAL_TABLET | Freq: Two times a day (BID) | ORAL | 0 refills | Status: DC

## 2016-09-01 NOTE — Progress Notes (Signed)
We are sorry that you are not feeling well.  Here is how we plan to help!  Based on what you have shared with me it looks like you have sinusitis.  Sinusitis is inflammation and infection in the sinus cavities of the head.  Based on your presentation I believe you most likely have Acute Bacterial Sinusitis.  This is an infection caused by bacteria and is treated with antibiotics. I have prescribed Doxycycline 100mg by mouth twice a day for 10 days. You may use an oral decongestant such as Mucinex D or if you have glaucoma or high blood pressure use plain Mucinex. Saline nasal spray help and can safely be used as often as needed for congestion.  If you develop worsening sinus pain, fever or notice severe headache and vision changes, or if symptoms are not better after completion of antibiotic, please schedule an appointment with a health care provider.    Sinus infections are not as easily transmitted as other respiratory infection, however we still recommend that you avoid close contact with loved ones, especially the very [place] and elderly.  Remember to wash your hands thoroughly throughout the day as this is the number one way to prevent the spread of infection!  Home Care:  Only take medications as instructed by your medical team.  Complete the entire course of an antibiotic.  Do not take these medications with alcohol.  A steam or ultrasonic humidifier can help congestion.  You can place a towel over your head and breathe in the steam from hot water coming from a faucet.  Avoid close contacts especially the very [place] and the elderly.  Cover your mouth when you cough or sneeze.  Always remember to wash your hands.  Get Help Right Away If:  You develop worsening fever or sinus pain.  You develop a severe head ache or visual changes.  Your symptoms persist after you have completed your treatment plan.  Make sure you  Understand these instructions.  Will watch your  condition.  Will get help right away if you are not doing well or get worse.  Your e-visit answers were reviewed by a board certified advanced clinical practitioner to complete your personal care plan.  Depending on the condition, your plan could have included both over the counter or prescription medications.  If there is a problem please reply  once you have received a response from your provider.  Your safety is important to us.  If you have drug allergies check your prescription carefully.    You can use MyChart to ask questions about today's visit, request a non-urgent call back, or ask for a work or school excuse for 24 hours related to this e-Visit. If it has been greater than 24 hours you will need to follow up with your provider, or enter a new e-Visit to address those concerns.  You will get an e-mail in the next two days asking about your experience.  I hope that your e-visit has been valuable and will speed your recovery. Thank you for using e-visits.    

## 2016-09-10 ENCOUNTER — Ambulatory Visit: Payer: 59 | Admitting: Physician Assistant

## 2016-09-17 ENCOUNTER — Ambulatory Visit (INDEPENDENT_AMBULATORY_CARE_PROVIDER_SITE_OTHER): Payer: 59 | Admitting: Family Medicine

## 2016-09-17 ENCOUNTER — Encounter: Payer: Self-pay | Admitting: Family Medicine

## 2016-09-17 VITALS — BP 118/67 | HR 72 | Temp 98.4°F | Wt 140.0 lb

## 2016-09-17 DIAGNOSIS — R0982 Postnasal drip: Secondary | ICD-10-CM | POA: Diagnosis not present

## 2016-09-17 MED ORDER — BENZONATATE 200 MG PO CAPS: 200.0000 mg | ORAL_CAPSULE | Freq: Three times a day (TID) | ORAL | 0 refills | Status: DC | PRN

## 2016-09-17 MED ORDER — IPRATROPIUM BROMIDE 0.06 % NA SOLN: 2.0000 | NASAL | 6 refills | Status: DC | PRN

## 2016-09-17 MED ORDER — PREDNISONE 10 MG PO TABS: 30.0000 mg | ORAL_TABLET | Freq: Every day | ORAL | 0 refills | Status: DC

## 2016-09-17 NOTE — Patient Instructions (Signed)
Thank you for coming in today. Use the nasal spray.  Take prednisone if worse.  Use tessalon cough pills as needed.   Recheck if not better.    Upper Respiratory Infection, Adult Most upper respiratory infections (URIs) are caused by a virus. A URI affects the nose, throat, and upper air passages. The most common type of URI is often called "the common cold." Follow these instructions at home:  Take medicines only as told by your doctor.  Gargle warm saltwater or take cough drops to comfort your throat as told by your doctor.  Use a warm mist humidifier or inhale steam from a shower to increase air moisture. This may make it easier to breathe.  Drink enough fluid to keep your pee (urine) clear or pale yellow.  Eat soups and other clear broths.  Have a healthy diet.  Rest as needed.  Go back to work when your fever is gone or your doctor says it is okay. ? You may need to stay home longer to avoid giving your URI to others. ? You can also wear a face mask and wash your hands often to prevent spread of the virus.  Use your inhaler more if you have asthma.  Do not use any tobacco products, including cigarettes, chewing tobacco, or electronic cigarettes. If you need help quitting, ask your doctor. Contact a doctor if:  You are getting worse, not better.  Your symptoms are not helped by medicine.  You have chills.  You are getting more short of breath.  You have brown or red mucus.  You have yellow or brown discharge from your nose.  You have pain in your face, especially when you bend forward.  You have a fever.  You have puffy (swollen) neck glands.  You have pain while swallowing.  You have white areas in the back of your throat. Get help right away if:  You have very bad or constant: ? Headache. ? Ear pain. ? Pain in your forehead, behind your eyes, and over your cheekbones (sinus pain). ? Chest pain.  You have long-lasting (chronic) lung disease and any  of the following: ? Wheezing. ? Long-lasting cough. ? Coughing up blood. ? A change in your usual mucus.  You have a stiff neck.  You have changes in your: ? Vision. ? Hearing. ? Thinking. ? Mood. This information is not intended to replace advice given to you by your health care provider. Make sure you discuss any questions you have with your health care provider. Document Released: 08/14/2007 Document Revised: 10/29/2015 Document Reviewed: 06/02/2013 Elsevier Interactive Patient Education  2018 [employer]

## 2016-09-18 NOTE — Progress Notes (Signed)
Heather Pruitt is a 31 y.o. female who [name] today for sore throat and coughing congestion. Patient has had symptoms now for a few weeks. She had a telemedicine visit on 09/01/16 where she was prescribed doxycycline. She notes this helped a bit however she continues to experience sore throat posterior nasal drainage and mild nonproductive cough. The cough does not interfere with sleep. Her main complaint is a postnasal drainage. She continues over-the-counter medications including intermittent Afrin nasal spray. She is leaving for vacation later this week and is worried that she'll be sick on vacation.   Past Medical History:  Diagnosis Date  . Anxiety   . Asthma    as child  . Contraceptive management 04/19/2013   Past Surgical History:  Procedure Laterality Date  . NASAL SINUS SURGERY    . WISDOM TOOTH EXTRACTION  2006   Social History  Substance Use Topics  . Smoking status: Former Smoker    Types: Cigarettes  . Smokeless tobacco: Never Used  . Alcohol use Yes     Comment: occassionally   family history includes Cancer in her father and maternal uncle; Diabetes in her maternal grandfather, maternal grandmother, and maternal uncle; Heart disease in her father, maternal grandmother, and paternal grandfather; Hyperlipidemia in her mother; Hypertension in her father, paternal grandfather, and paternal grandmother; Stroke in her maternal grandfather.  ROS as above:  Medications: Current Outpatient Prescriptions  Medication Sig Dispense Refill  . ALPRAZolam (XANAX) 0.25 MG tablet take 1 to 2 tablets by mouth if needed for anxiety  0  . Cholecalciferol (VITAMIN D) 2000 UNITS tablet Take 2,000 Units by mouth daily.    . Multiple Vitamin (MULTIVITAMIN) tablet Take 1 tablet by mouth daily.    . norgestimate-ethinyl estradiol (SPRINTEC 28) 0.25-35 MG-MCG  tablet Take 1 tablet by mouth daily. 28 tablet 12  . ondansetron (ZOFRAN) 4 MG tablet Take 1 tablet (4 mg total) by mouth every 8 (eight) hours as needed for nausea or vomiting. 20 tablet 0  . venlafaxine XR (EFFEXOR-XR) 75 MG 24 hr capsule Take 1 capsule (75 mg total) by mouth daily. 30 capsule 5  . benzonatate (TESSALON) 200 MG capsule Take 1 capsule (200 mg total) by mouth 3 (three) times daily as needed for cough. 45 capsule 0  . ipratropium (ATROVENT) 0.06 % nasal spray Place 2 sprays into both nostrils every 4 (four) hours as needed for rhinitis. 10 mL 6  . predniSONE (DELTASONE) 10 MG tablet Take 3 tablets (30 mg total) by mouth daily with breakfast. 15 tablet 0   No current facility-administered medications for this visit.    Allergies  Allergen Reactions  . Augmentin [Amoxicillin-Pot Clavulanate] Diarrhea  . Clindamycin/Lincomycin Other (See Comments)    Got C diff after taking  . Vancomycin Other (See Comments)    Ringing in ears, dizzy    Health Maintenance Health Maintenance  Topic Date Due  . HIV Screening  03/28/2002  . TETANUS/TDAP  03/28/2006  . PAP SMEAR  04/09/2018     Exam:  BP 118/67   Pulse 72   Temp 98.4 F (36.9 C) (Oral)   Wt 140 lb (63.5 kg)   SpO2 99%   BMI 24.03 kg/m  Gen: Well NAD HEENT: EOMI,  MMM Mildly inflamed nasal turbinates bilaterally with clear nasal discharge. Nontender maxillary and frontal sinuses. Posterior pharynx with cobblestoning. Normal tympanic membranes bilaterally. No significant cervical lymphadenopathy present bilaterally.  Lungs: Normal work of breathing. CTABL Heart: RRR no MRG Abd: NABS, Soft. Nondistended, Nontender Exts: Brisk capillary refill, warm and well perfused.    No results found for this or any previous visit (from the past 72 hour(s)). No results found.    Assessment and Plan: 31 y.o. female with resolving viral sinusitis likely. There may be a component of allergic postnasal drainage as well. Her  biggest complaint is the postnasal drainage. Would treat symptomatically with Atrovent nasal spray. Additionally will use Tessalon Perles for daytime cough. Backup prednisone for use for sore throat his symptoms do not improve on vacation.   No orders of the defined types were placed in this encounter.  Meds ordered this encounter  Medications  . ipratropium (ATROVENT) 0.06 % nasal spray    Sig: Place 2 sprays into both nostrils every 4 (four) hours as needed for rhinitis.    Dispense:  10 mL    Refill:  6  . benzonatate (TESSALON) 200 MG capsule    Sig: Take 1 capsule (200 mg total) by mouth 3 (three) times daily as needed for cough.    Dispense:  45 capsule    Refill:  0  . predniSONE (DELTASONE) 10 MG tablet    Sig: Take 3 tablets (30 mg total) by mouth daily with breakfast.    Dispense:  15 tablet    Refill:  0     Discussed warning signs or symptoms. Please see discharge instructions. Patient expresses understanding.

## 2016-12-11 ENCOUNTER — Other Ambulatory Visit: Payer: Self-pay | Admitting: Adult Health

## 2017-01-14 ENCOUNTER — Ambulatory Visit: Payer: 59 | Admitting: Physician Assistant

## 2017-01-20 ENCOUNTER — Ambulatory Visit: Payer: 59 | Admitting: Physician Assistant

## 2017-01-21 ENCOUNTER — Ambulatory Visit: Payer: 59 | Admitting: Physician Assistant

## 2017-01-21 ENCOUNTER — Telehealth: Payer: Self-pay | Admitting: Physician Assistant

## 2017-01-21 ENCOUNTER — Encounter: Payer: Self-pay | Admitting: Physician Assistant

## 2017-01-21 VITALS — BP 108/64 | HR 77 | Ht 64.0 in | Wt 137.0 lb

## 2017-01-21 DIAGNOSIS — Z13220 Encounter for screening for lipoid disorders: Secondary | ICD-10-CM

## 2017-01-21 DIAGNOSIS — Z131 Encounter for screening for diabetes mellitus: Secondary | ICD-10-CM

## 2017-01-21 DIAGNOSIS — F411 Generalized anxiety disorder: Secondary | ICD-10-CM | POA: Diagnosis not present

## 2017-01-21 MED ORDER — ALPRAZOLAM 0.25 MG PO TABS: ORAL_TABLET | ORAL | 2 refills | Status: DC

## 2017-01-21 MED ORDER — VENLAFAXINE HCL ER 75 MG PO CP24: 75.0000 mg | ORAL_CAPSULE | Freq: Every day | ORAL | 11 refills | Status: DC

## 2017-01-21 NOTE — Progress Notes (Signed)
Subjective:    Patient ID: Heather Pruitt, female    DOB: August 16, 1985, 31 y.o.   MRN: [id number]  HPI Patient is a 31 year old female who presents to the clinic to follow-up on generalized anxiety disorder and refill of Effexor.  She is doing great on this medication.  She feels like she is much less anxious and overall she feels motivated and not down.  She denies any side effects of medication.  She does mention that she is having some worsening fatigue and mood changes around her.  She has a visit with her GYN for this this month.  .. Active Ambulatory Problems    Diagnosis Date Noted  . ALLERGIC RHINITIS 02/17/2006  . ASTHMA 02/17/2006  . CONSTIPATION 02/17/2006  . INFECTION, URINARY TRACT NOS 02/17/2006  . Contraceptive management 04/19/2013  . History of Clostridium difficile infection 08/13/2016  . Gastroesophageal reflux disease with esophagitis 08/14/2016  . Nausea 08/14/2016  . GAD (generalized anxiety disorder) 08/14/2016  . Diarrhea 08/14/2016   Resolved Ambulatory Problems    Diagnosis Date Noted  . No Resolved Ambulatory Problems   Past Medical History:  Diagnosis Date  . Anxiety   . Asthma   . Contraceptive management 04/19/2013      Review of Systems  All other systems reviewed and are negative.      Objective:   Physical Exam  Constitutional: She is oriented to person, place, and time. She appears well-developed and well-nourished.  Cardiovascular: Normal rate, regular rhythm and normal heart sounds.  Pulmonary/Chest: Effort normal and breath sounds normal.  Neurological: She is alert and oriented to person, place, and time.  Psychiatric: She has a normal mood and affect. Her behavior is normal.          Assessment & Plan:  [name].[name].Heather Pruitt was seen today for anxiety.  Diagnoses and all orders for this visit:  GAD (generalized anxiety disorder) -     venlafaxine XR (EFFEXOR-XR) 75 MG 24 hr capsule; Take 1 capsule (75 mg total) daily by  mouth.  Screening for lipid disorders -     Lipid Panel w/reflex Direct LDL  Screening for diabetes mellitus -     COMPLETE METABOLIC PANEL WITH GFR  Other orders -     ALPRAZolam (XANAX) 0.25 MG tablet; take 1 to 2 tablets by mouth if needed for anxiety   .[name]. [facility] 01/21/2017 08/13/2016  Decreased Interest 1 0  Down, Depressed, Hopeless 0 0  PHQ - 2 Score 1 0  Altered sleeping 0 0  Tired, decreased energy 2 1  Change in appetite 0 0  Feeling bad or failure about yourself  0 0  Trouble concentrating 2 1  Moving slowly or fidgety/restless 0 0  Suicidal thoughts 0 0  PHQ-9 Score 5 2  Difficult doing work/chores Not difficult at all -   .. GAD 7 : Generalized Anxiety Score 01/21/2017 08/13/2016  Nervous, Anxious, on Edge 0 0  Control/stop worrying 0 0  Worry too much - different things 0 1  Trouble relaxing 0 0  Restless 0 0  Easily annoyed or irritable 1 2  Afraid - awful might happen 0 1  Total GAD 7 Score 1 4  Anxiety Difficulty Not difficult at all -    Refilled effexor for 1 year.   Discussed to make sure her GYN checks-B12, vitamin D, cBC, ferritin 4 fatigue symptoms.  I ordered a lipid and CMP to have drawn with labs since she does not like  to have blood drawn very often.

## 2017-01-21 NOTE — Patient Instructions (Addendum)
Progesterone in last 2 weeks of cycle.  Make sure they test vitamin b12, TSH, vitamin D, CBC, ferritin,

## 2017-01-28 ENCOUNTER — Encounter: Payer: Self-pay | Admitting: Physician Assistant

## 2017-01-28 ENCOUNTER — Ambulatory Visit (INDEPENDENT_AMBULATORY_CARE_PROVIDER_SITE_OTHER): Payer: 59

## 2017-01-28 ENCOUNTER — Ambulatory Visit: Payer: 59 | Admitting: Physician Assistant

## 2017-01-28 VITALS — BP 121/71 | HR 89 | Temp 98.2°F | Ht 64.0 in | Wt 139.0 lb

## 2017-01-28 DIAGNOSIS — J069 Acute upper respiratory infection, unspecified: Secondary | ICD-10-CM

## 2017-01-28 DIAGNOSIS — B9789 Other viral agents as the cause of diseases classified elsewhere: Secondary | ICD-10-CM | POA: Diagnosis not present

## 2017-01-28 DIAGNOSIS — R059 Cough, unspecified: Secondary | ICD-10-CM

## 2017-01-28 DIAGNOSIS — R05 Cough: Secondary | ICD-10-CM

## 2017-01-28 NOTE — Progress Notes (Signed)
   Subjective:    Patient ID: Heather Pruitt, female    DOB: 07-02-1985, 31 y.o.   MRN: [id number]  HPI Pt is a [age] female who presents to the clinic to follow-up on previous diagnosis of upper respiratory viral syndrome.  She was seen in urgent care on 01/24/2017 and swabbed negative for flu and strep.  She does feel like she is better but she has acquired a productive cough.  She denies any fever, chills, body aches.  She is concerned because her husband was diagnosed with pneumonia 2 days ago.  She was treating herself with conservative measures over-the-counter such as Tylenol and ibuprofen.  .. Active Ambulatory Problems    Diagnosis Date Noted  . ALLERGIC RHINITIS 02/17/2006  . ASTHMA 02/17/2006  . CONSTIPATION 02/17/2006  . INFECTION, URINARY TRACT NOS 02/17/2006  . Contraceptive management 04/19/2013  . History of Clostridium difficile infection 08/13/2016  . Gastroesophageal reflux disease with esophagitis 08/14/2016  . Nausea 08/14/2016  . GAD (generalized anxiety disorder) 08/14/2016  . Diarrhea 08/14/2016   Resolved Ambulatory Problems    Diagnosis Date Noted  . No Resolved Ambulatory Problems   Past Medical History:  Diagnosis Date  . Anxiety   . Asthma   . Contraceptive management 04/19/2013      Review of Systems    see HPI>  Objective:   Physical Exam  Constitutional: She is oriented to person, place, and time. She appears well-developed and well-nourished.  Pale complexion.   HENT:  Head: Normocephalic and atraumatic.  Right Ear: External ear normal.  Left Ear: External ear normal.  Nose: Nose normal.  Mouth/Throat: Oropharynx is clear and moist.  Eyes: Conjunctivae are normal. Right eye exhibits no discharge. Left eye exhibits no discharge.  Neck: Normal range of motion. Neck supple.  Cardiovascular: Normal rate, regular rhythm and normal heart sounds.  Pulmonary/Chest: Effort normal and breath sounds normal.  Lymphadenopathy:    She has cervical  adenopathy.  Neurological: She is alert and oriented to person, place, and time.  Psychiatric: She has a normal mood and affect. Her behavior is normal.          Assessment & Plan:  [name].[name].Heather Pruitt was seen today for cough and sinus pressure.  Diagnoses and all orders for this visit:  Viral upper respiratory tract infection with cough  Cough -     DG Chest 2 View   Everything I see today appears viral. Vitals are stable. Lung exam is normal. Will get an CXR due to husband having pneumonia this week and to confirm we don't need to treat with abx. Discussed conservative treatment. Encouraged use of tylenol cold and sinus. Certainly with new cough she could have some bronchitis. HO given.   reveiwed CXR and negative for pneumonia. Pt aware to continue with conservative treatment.

## 2017-01-28 NOTE — Patient Instructions (Signed)
tylenonol cold sinus severe.   Acute Bronchitis, Adult Acute bronchitis is when air tubes (bronchi) in the lungs suddenly get swollen. The condition can make it hard to breathe. It can also cause these symptoms:  A cough.  Coughing up clear, yellow, or green mucus.  Wheezing.  Chest congestion.  Shortness of breath.  A fever.  Body aches.  Chills.  A sore throat.  Follow these instructions at home: Medicines  Take over-the-counter and prescription medicines only as told by your doctor.  If you were prescribed an antibiotic medicine, take it as told by your doctor. Do not stop taking the antibiotic even if you start to feel better. General instructions  Rest.  Drink enough fluids to keep your pee (urine) clear or pale yellow.  Avoid smoking and secondhand smoke. If you smoke and you need help quitting, ask your doctor. Quitting will help your lungs heal faster.  Use an inhaler, cool mist vaporizer, or humidifier as told by your doctor.  Keep all follow-up visits as told by your doctor. This is important. How is this prevented? To lower your risk of getting this condition again:  Wash your hands often with soap and water. If you cannot use soap and water, use hand sanitizer.  Avoid contact with people who have cold symptoms.  Try not to touch your hands to your mouth, nose, or eyes.  Make sure to get the flu shot every year.  Contact a doctor if:  Your symptoms do not get better in 2 weeks. Get help right away if:  You cough up blood.  You have chest pain.  You have very bad shortness of breath.  You become dehydrated.  You faint (pass out) or keep feeling like you are going to pass out.  You keep throwing up (vomiting).  You have a very bad headache.  Your fever or chills gets worse. This information is not intended to replace advice given to you by your health care provider. Make sure you discuss any questions you have with your health care  provider. Document Released: 08/14/2007 Document Revised: 10/04/2015 Document Reviewed: 08/16/2015 Elsevier Interactive Patient Education  2017 [employer]

## 2017-01-28 NOTE — Progress Notes (Signed)
Call pt: lungs look great. No acute findings.

## 2017-01-29 ENCOUNTER — Encounter: Payer: 59 | Admitting: Obstetrics & Gynecology

## 2017-02-07 ENCOUNTER — Telehealth: Payer: Self-pay

## 2017-02-07 MED ORDER — PREDNISONE 20 MG PO TABS: 40.0000 mg | ORAL_TABLET | Freq: Every day | ORAL | 0 refills | Status: DC

## 2017-02-07 NOTE — Telephone Encounter (Signed)
OK, new rs sent for prednisone. If not better after 2-3 days then needs Office visit. If fever needs CXR.

## 2017-02-07 NOTE — Telephone Encounter (Signed)
Heather HarmanDana called and states she still has wheezing and a cough. She has taken Robitussin with some relief. Denies fever, chills or sweats. She does feel some better with the exception of the cough and wheezing. Please advise.

## 2017-02-10 NOTE — Telephone Encounter (Signed)
PATIENT HAS BEEN INFORMED. Rhonda Cunningham,CMA  

## 2017-02-14 ENCOUNTER — Ambulatory Visit: Payer: 59 | Admitting: Physician Assistant

## 2017-02-14 ENCOUNTER — Encounter: Payer: Self-pay | Admitting: Physician Assistant

## 2017-02-14 VITALS — BP 110/64 | HR 85 | Ht 64.0 in | Wt 141.0 lb

## 2017-02-14 DIAGNOSIS — J0140 Acute pansinusitis, unspecified: Secondary | ICD-10-CM

## 2017-02-14 DIAGNOSIS — R05 Cough: Secondary | ICD-10-CM

## 2017-02-14 DIAGNOSIS — R059 Cough, unspecified: Secondary | ICD-10-CM

## 2017-02-14 MED ORDER — AZITHROMYCIN 250 MG PO TABS: ORAL_TABLET | ORAL | 0 refills | Status: DC

## 2017-02-14 MED ORDER — ALBUTEROL SULFATE HFA 108 (90 BASE) MCG/ACT IN AERS
2.0000 | INHALATION_SPRAY | Freq: Four times a day (QID) | RESPIRATORY_TRACT | 2 refills | Status: DC | PRN
Start: 2017-02-14 — End: 2018-01-27

## 2017-02-14 NOTE — Patient Instructions (Signed)

## 2017-02-15 ENCOUNTER — Encounter: Payer: Self-pay | Admitting: Physician Assistant

## 2017-02-15 NOTE — Progress Notes (Signed)
   Subjective:    Patient ID: Heather Pruitt, female    DOB: 03/16/1985, 31 y.o.   MRN: [id number]  HPI Pt is a 31 yo female who presents to the clinic with persistent cough and some chest tightness. She was seen on 11/16 for viral syndrome and then again on 11/20 with same virus. CXR done that was negative. She called back and sent prednisone. She has improved a lot but she is still coughing and feels SOB with some chest tightness. She now seems to have more sinus pressure and drainage associated. No fever, chills, body aches. She wants to get over this before the snow storm. In the past she has been given albuterol inhaler and has seemed to help.   .. Active Ambulatory Problems    Diagnosis Date Noted  . ALLERGIC RHINITIS 02/17/2006  . ASTHMA 02/17/2006  . CONSTIPATION 02/17/2006  . INFECTION, URINARY TRACT NOS 02/17/2006  . Contraceptive management 04/19/2013  . History of Clostridium difficile infection 08/13/2016  . Gastroesophageal reflux disease with esophagitis 08/14/2016  . Nausea 08/14/2016  . GAD (generalized anxiety disorder) 08/14/2016  . Diarrhea 08/14/2016   Resolved Ambulatory Problems    Diagnosis Date Noted  . No Resolved Ambulatory Problems   Past Medical History:  Diagnosis Date  . Anxiety   . Asthma   . Contraceptive management 04/19/2013      Review of Systems  All other systems reviewed and are negative.      Objective:   Physical Exam  Constitutional: She is oriented to person, place, and time. She appears well-developed and well-nourished.  HENT:  Head: Normocephalic and atraumatic.  Right Ear: External ear normal.  Left Ear: External ear normal.  Nose: Nose normal.  Mouth/Throat: Oropharynx is clear and moist. No oropharyngeal exudate.  Oropharynx erythematous.  TM's clear.    Eyes: Conjunctivae are normal.  Neck: Normal range of motion. Neck supple.  Cardiovascular: Normal rate, regular rhythm and normal heart sounds.  Pulmonary/Chest:  Effort normal and breath sounds normal. She has no wheezes.  Lymphadenopathy:    She has cervical adenopathy.  Neurological: She is alert and oriented to person, place, and time.  Psychiatric: She has a normal mood and affect. Her behavior is normal.          Assessment & Plan:  [name].[name].[name] was seen today for cough.  Diagnoses and all orders for this visit:  Acute non-recurrent pansinusitis -     azithromycin (ZITHROMAX) 250 MG tablet; Take 2 tablets now and then one tablet for 4 days.  Cough -     albuterol (PROVENTIL HFA;VENTOLIN HFA) 108 (90 Base) MCG/ACT inhaler; Inhale 2 puffs into the lungs every 6 (six) hours as needed for wheezing or shortness of breath.  reassurance given to patient. Since this has been ongoing for last month. It appears she has developed some sinusitis along the way. zpak given. Albuterol given to use as needed for cough/sob. I don't see any reason to use prednisone again. Discussed post viral cough can take a few weeks to resolve.

## 2017-02-18 ENCOUNTER — Encounter: Payer: Self-pay | Admitting: Physician Assistant

## 2017-02-20 ENCOUNTER — Encounter: Payer: Self-pay | Admitting: Physician Assistant

## 2017-03-05 ENCOUNTER — Ambulatory Visit (INDEPENDENT_AMBULATORY_CARE_PROVIDER_SITE_OTHER): Payer: 59 | Admitting: Obstetrics & Gynecology

## 2017-03-05 ENCOUNTER — Encounter: Payer: Self-pay | Admitting: Obstetrics & Gynecology

## 2017-03-05 VITALS — BP 117/73 | HR 74 | Ht 64.0 in | Wt 143.0 lb

## 2017-03-05 DIAGNOSIS — Z124 Encounter for screening for malignant neoplasm of cervix: Secondary | ICD-10-CM

## 2017-03-05 DIAGNOSIS — Z01419 Encounter for gynecological examination (general) (routine) without abnormal findings: Secondary | ICD-10-CM

## 2017-03-05 MED ORDER — NORGESTREL-ETHINYL ESTRADIOL 0.3-30 MG-MCG PO TABS: 1.0000 | ORAL_TABLET | Freq: Every day | ORAL | 11 refills | Status: DC

## 2017-03-05 NOTE — Progress Notes (Signed)
Subjective:    Heather FilesDana M Pruitt is a 31 y.o. married P0 female who presents for an annual exam. She reports that she has had some fatigue and irritability over the last year, worse within the week or so prior to her period.  The patient is sexually active. GYN screening history: last pap: was normal. The patient wears seatbelts: yes. The patient participates in regular exercise: yes. ( usually cardio and weight lifting) Has the patient ever been transfused or tattooed?: yes. The patient reports that there is not domestic violence in her life.   Menstrual History: OB History    Gravida Para Term Preterm AB Living   0 0 0 0 0 0   SAB TAB Ectopic Multiple Live Births   0 0 0 0        Menarche age: [age] Patient's last menstrual period was 02/12/2017.    The following portions of the patient's history were reviewed and updated as appropriate: allergies, current medications, past family history, past medical history, past social history, past surgical history and problem list.  Review of Systems Pertinent items are noted in HPI.   Fh- + DM, no breast/gyn/colon cancer  Married for 1 year, monogamous since 31 years old Environmental health practitionerAdministrative assistant, server at [employer] Denies dyspareunia, uses condoms and OCPs (bad about remembering to take pills), DOES NOT WANT KIDS. Happy with OCPs for menstrual regulation, had oligomenorrhea without pills   Objective:    BP 117/73   Pulse 74   Ht 5\' 4"  (1.626 m)   Wt 143 lb (64.9 kg)   LMP 02/12/2017   BMI 24.55 kg/m   General Appearance:    Alert, cooperative, no distress, appears stated age  Head:    Normocephalic, without obvious abnormality, atraumatic  Eyes:    PERRL, conjunctiva/corneas clear, EOM's intact, fundi    benign, both eyes  Ears:    Normal TM's and external ear canals, both ears  Nose:   Nares normal, septum midline, mucosa normal, no drainage    or sinus tenderness  Throat:   Lips, mucosa, and tongue normal; teeth and gums normal  Neck:    Supple, symmetrical, trachea midline, no adenopathy;    thyroid:  no enlargement/tenderness/nodules; no carotid   bruit or JVD  Back:     Symmetric, no curvature, ROM normal, no CVA tenderness  Lungs:     Clear to auscultation bilaterally, respirations unlabored  Chest Wall:    No tenderness or deformity   Heart:    Regular rate and rhythm, S1 and S2 normal, no murmur, rub   or gallop  Breast Exam:    No tenderness, masses, or nipple abnormality  Abdomen:     Soft, non-tender, bowel sounds active all four quadrants,    no masses, no organomegaly  Genitalia:    Normal female without lesion, discharge or tenderness, NSSA, NT, normal adnexal exam     Extremities:   Extremities normal, atraumatic, no cyanosis or edema  Pulses:   2+ and symmetric all extremities  Skin:   Skin color, texture, turgor normal, no rashes or lesions  Lymph nodes:   Cervical, supraclavicular, and axillary nodes normal  Neurologic:   CNII-XII intact, normal strength, sensation and reflexes    throughout  .    Assessment:    Healthy female exam.    Plan:     Thin prep Pap smear.   Change to lo ovral She may have OCPs refills for 3 years without a visit.

## 2017-03-06 ENCOUNTER — Other Ambulatory Visit: Payer: 59

## 2017-03-06 ENCOUNTER — Other Ambulatory Visit (INDEPENDENT_AMBULATORY_CARE_PROVIDER_SITE_OTHER): Payer: 59

## 2017-03-06 DIAGNOSIS — Z01419 Encounter for gynecological examination (general) (routine) without abnormal findings: Secondary | ICD-10-CM | POA: Diagnosis not present

## 2017-03-06 LAB — CYTOLOGY - PAP: Diagnosis: NEGATIVE

## 2017-03-07 LAB — LIPID PANEL
CHOL/HDL RATIO: 2.8 (calc) (ref <5.0)
Cholesterol: 170 mg/dL (ref <200)
HDL: 60 mg/dL (ref >50)
LDL Cholesterol (Calc): 87 mg/dL (calc)
NON-HDL CHOLESTEROL (CALC): 110 mg/dL (ref <130)
Triglycerides: 126 mg/dL (ref <150)

## 2017-03-07 LAB — COMPREHENSIVE METABOLIC PANEL
AG Ratio: 1.6 (calc) (ref 1.0–2.5)
ALBUMIN MSPROF: 3.9 g/dL (ref 3.6–5.1)
ALT: 11 U/L (ref 6–29)
AST: 20 U/L (ref 10–30)
Alkaline phosphatase (APISO): 48 U/L (ref 33–115)
BUN: 19 mg/dL (ref 7–25)
CHLORIDE: 105 mmol/L (ref 98–110)
CO2: 22 mmol/L (ref 20–32)
CREATININE: 0.83 mg/dL (ref 0.50–1.10)
Calcium: 8.8 mg/dL (ref 8.6–10.2)
GLOBULIN: 2.5 g/dL (ref 1.9–3.7)
GLUCOSE: 103 mg/dL — AB (ref 65–99)
POTASSIUM: 4.5 mmol/L (ref 3.5–5.3)
SODIUM: 137 mmol/L (ref 135–146)
Total Bilirubin: 0.4 mg/dL (ref 0.2–1.2)
Total Protein: 6.4 g/dL (ref 6.1–8.1)

## 2017-03-07 LAB — CBC
HEMATOCRIT: 41.2 % (ref 35.0–45.0)
HEMOGLOBIN: 14 g/dL (ref 11.7–15.5)
MCH: 32.3 pg (ref 27.0–33.0)
MCHC: 34 g/dL (ref 32.0–36.0)
MCV: 94.9 fL (ref 80.0–100.0)
MPV: 10.6 fL (ref 7.5–12.5)
Platelets: 271 10*3/uL (ref 140–400)
RBC: 4.34 10*6/uL (ref 3.80–5.10)
RDW: 11.6 % (ref 11.0–15.0)
WBC: 6.2 10*3/uL (ref 3.8–10.8)

## 2017-03-07 LAB — VITAMIN B12: Vitamin B-12: 1278 pg/mL — ABNORMAL HIGH (ref 200–1100)

## 2017-03-07 LAB — VITAMIN D 25 HYDROXY (VIT D DEFICIENCY, FRACTURES): VIT D 25 HYDROXY: 43 ng/mL (ref 30–100)

## 2017-03-07 LAB — TSH: TSH: 3.8 mIU/L

## 2017-03-07 LAB — SPECIMEN COMPROMISED

## 2017-03-07 LAB — VITAMIN B1

## 2017-03-13 ENCOUNTER — Encounter: Payer: Self-pay | Admitting: Obstetrics & Gynecology

## 2017-06-16 NOTE — Telephone Encounter (Signed)
error 

## 2018-01-13 ENCOUNTER — Other Ambulatory Visit: Payer: Self-pay | Admitting: *Deleted

## 2018-01-13 MED ORDER — NORGESTREL-ETHINYL ESTRADIOL 0.3-30 MG-MCG PO TABS: 1.0000 | ORAL_TABLET | Freq: Every day | ORAL | 11 refills | Status: DC

## 2018-01-13 NOTE — Telephone Encounter (Signed)
Pt called requesting a RF on her OCP's.  Per Dr Marice Potter pt does not have to come in but every 3 years for a pap but can have her OCP's RF.  RX sent to [facility]

## 2018-01-27 ENCOUNTER — Encounter: Payer: Self-pay | Admitting: Physician Assistant

## 2018-01-27 ENCOUNTER — Ambulatory Visit: Payer: 59 | Admitting: Physician Assistant

## 2018-01-27 VITALS — BP 122/76 | HR 77 | Ht 64.0 in | Wt 144.0 lb

## 2018-01-27 DIAGNOSIS — F411 Generalized anxiety disorder: Secondary | ICD-10-CM

## 2018-01-27 MED ORDER — ALPRAZOLAM 0.25 MG PO TABS: ORAL_TABLET | ORAL | 2 refills | Status: DC

## 2018-01-27 MED ORDER — VENLAFAXINE HCL ER 150 MG PO CP24: 150.0000 mg | ORAL_CAPSULE | Freq: Every day | ORAL | 1 refills | Status: DC

## 2018-01-27 NOTE — Progress Notes (Signed)
Subjective:    Patient ID: Heather Pruitt, female    DOB: 18-Nov-1985, 32 y.o.   MRN: [id number]  HPI The patient is a 32 year old female with GAD who presents to the clinic for medication refill and follow-up.  She is taking Effexor daily with pretty good results.  She sparingly uses Xanax.  Her husband recently quit his job and they are making significantly less than they were.  She is having to work 3 jobs to pay the bills.  She is very stressed.  She notices she is having more outbursts of anger.  [place] May [place].  She is hoping this will overall help with her symptoms.  She denies any suicidal thoughts or homicidal idealizations.  .. Active Ambulatory Problems    Diagnosis Date Noted  . ALLERGIC RHINITIS 02/17/2006  . ASTHMA 02/17/2006  . CONSTIPATION 02/17/2006  . INFECTION, URINARY TRACT NOS 02/17/2006  . Contraceptive management 04/19/2013  . History of Clostridium difficile infection 08/13/2016  . Gastroesophageal reflux disease with esophagitis 08/14/2016  . Nausea 08/14/2016  . GAD (generalized anxiety disorder) 08/14/2016  . Diarrhea 08/14/2016   Resolved Ambulatory Problems    Diagnosis Date Noted  . No Resolved Ambulatory Problems   Past Medical History:  Diagnosis Date  . Anxiety   . Asthma       Review of Systems  All other systems reviewed and are negative.      Objective:   Physical Exam  Constitutional: She is oriented to person, place, and time. She appears well-developed and well-nourished.  HENT:  Head: Normocephalic and atraumatic.  Cardiovascular: Normal rate and regular rhythm.  Pulmonary/Chest: Effort normal.  Neurological: She is alert and oriented to person, place, and time.  Psychiatric: She has a normal mood and affect. Her behavior is normal.          Assessment & Plan:  [name].[name].Diagnoses and all orders for this visit:  GAD (generalized anxiety disorder) -     venlafaxine  XR (EFFEXOR-XR) 150 MG 24 hr capsule; Take 1 capsule (150 mg total) by mouth daily with breakfast. -     ALPRAZolam (XANAX) 0.25 MG tablet; take 1 to 2 tablets by mouth if needed for anxiety    .[name]. [facility] 01/21/2017 08/13/2016  Decreased Interest 1 0  Down, Depressed, Hopeless 0 0  PHQ - 2 Score 1 0  Altered sleeping 0 0  Tired, decreased energy 2 1  Change in appetite 0 0  Feeling bad or failure about yourself  0 0  Trouble concentrating 2 1  Moving slowly or fidgety/restless 0 0  Suicidal thoughts 0 0  PHQ-9 Score 5 2  Difficult doing work/chores Not difficult at all -   .. GAD 7 : Generalized Anxiety Score 01/21/2017 08/13/2016  Nervous, Anxious, on Edge 0 0  Control/stop worrying 0 0  Worry too much - different things 0 1  Trouble relaxing 0 0  Restless 0 0  Easily annoyed or irritable 1 2  Afraid - awful might happen 0 1  Total GAD 7 Score 1 4  Anxiety Difficulty Not difficult at all -    Patient's numbers do not reflect any worsening in depression or anxiety however patient does report to be much more anxious and to have more frequent outburst of anger.  We did discuss and she would like to increase her Effexor to 150 mg daily.  Will refill Xanax for  very as needed sparingly usage.  She does not need any refills now therefore print out for later.  Spent some time discussing some self coping and relaxation techniques to help with her overall stress and anxiety.  Strongly encouraged her to take some time for herself since she is working so much.  Marked moving in 07/2018.

## 2018-06-23 ENCOUNTER — Other Ambulatory Visit: Payer: Self-pay | Admitting: Physician Assistant

## 2018-06-23 ENCOUNTER — Encounter: Payer: Self-pay | Admitting: Physician Assistant

## 2018-06-23 DIAGNOSIS — F411 Generalized anxiety disorder: Secondary | ICD-10-CM

## 2018-06-23 MED ORDER — VENLAFAXINE HCL ER 150 MG PO CP24: 150.0000 mg | ORAL_CAPSULE | Freq: Every day | ORAL | 5 refills | Status: DC

## 2018-06-23 NOTE — Telephone Encounter (Signed)
Pt not seen since November 2019. Left pt msg she needs to schedule virtual visit with Lesly Rubenstein to be seen in order to obtain refills

## 2018-06-26 ENCOUNTER — Telehealth (INDEPENDENT_AMBULATORY_CARE_PROVIDER_SITE_OTHER): Payer: 59 | Admitting: Physician Assistant

## 2018-06-26 DIAGNOSIS — F411 Generalized anxiety disorder: Secondary | ICD-10-CM

## 2018-06-26 MED ORDER — ALPRAZOLAM 0.25 MG PO TABS: ORAL_TABLET | ORAL | 2 refills | Status: DC

## 2018-06-26 NOTE — Progress Notes (Signed)
Doing okay. Was going to move to [facility] in May but it got delayed due to Covid so some stress/anxiety related to this. PHQ-9/GAD7 completed. Needs Xanax refilled.

## 2018-06-26 NOTE — Progress Notes (Signed)
Patient ID: Heather FilesDana M Pruitt, female   DOB: January 28, 1986, 33 y.o.   MRN: [id number] .[name].Virtual Visit via Video Note  I connected with Heather Pruitt on 06/26/18 at 11:10 AM EDT by a video enabled telemedicine application and verified that I am speaking with the correct person using two identifiers.   I discussed the limitations of evaluation and management by telemedicine and the availability of in person appointments. The patient expressed understanding and agreed to proceed.  History of Present Illness: Pt is a [age] yo female with GAD who needs refills. [place] but due to the COVID pandemic plans are halted. Her anxiety has increased some. She continues to take effexor daily with great results. She needs xanax refilled. She continues to only take when needed and not every day. Her last refill was 04/2017.   .. Active Ambulatory Problems    Diagnosis Date Noted  . ALLERGIC RHINITIS 02/17/2006  . ASTHMA 02/17/2006  . CONSTIPATION 02/17/2006  . INFECTION, URINARY TRACT NOS 02/17/2006  . Contraceptive management 04/19/2013  . History of Clostridium difficile infection 08/13/2016  . Gastroesophageal reflux disease with esophagitis 08/14/2016  . Nausea 08/14/2016  . GAD (generalized anxiety disorder) 08/14/2016  . Diarrhea 08/14/2016   Resolved Ambulatory Problems    Diagnosis Date Noted  . No Resolved Ambulatory Problems   Past Medical History:  Diagnosis Date  . Anxiety   . Asthma    Reviewed med, allergy, problem list.    Observations/Objective: No acute distress. Mood WNL.  [name].. Today's Vitals   06/26/18 1042  Temp: 97.6 F (36.4 C)  TempSrc: Oral  Weight: 143 lb (64.9 kg)  Height: 5\' 4"  (1.626 m)   Body mass index is 24.55 kg/m.  .. [facility] 06/26/2018 01/21/2017 08/13/2016  Decreased Interest 0 1 0  Down, Depressed, Hopeless 0 0 0  PHQ - 2 Score 0 1 0  Altered sleeping 1 0 0  Tired, decreased energy 0 2 1  Change  in appetite 0 0 0  Feeling bad or failure about yourself  0 0 0  Trouble concentrating 2 2 1   Moving slowly or fidgety/restless 0 0 0  Suicidal thoughts 0 0 0  PHQ-9 Score 3 5 2   Difficult doing work/chores Somewhat difficult Not difficult at all -   .. GAD 7 : Generalized Anxiety Score 06/26/2018 01/21/2017 08/13/2016  Nervous, Anxious, on Edge 2 0 0  Control/stop worrying 1 0 0  Worry too much - different things 1 0 1  Trouble relaxing 0 0 0  Restless 0 0 0  Easily annoyed or irritable 1 1 2   Afraid - awful might happen 1 0 1  Total GAD 7 Score 6 1 4   Anxiety Difficulty Not difficult at all Not difficult at all -     Assessment and Plan: [name].[name].[name] was seen today for anxiety.  Diagnoses and all orders for this visit:  GAD (generalized anxiety disorder) -     ALPRAZolam (XANAX) 0.25 MG tablet; take 1 to 2 tablets by mouth if needed for anxiety   Pt is also on effexor but she does not need refills. She is only using xanax as needed. Discussed dependency risk. Do not use every day. Discussed other ways to cope with anxiety.    Last refill 04/2017. Over a year since last rx.  [name]..PDMP reviewed during this encounter.   Follow Up Instructions:    I discussed the assessment and treatment  plan with the patient. The patient was provided an opportunity to ask questions and all were answered. The patient agreed with the plan and demonstrated an understanding of the instructions.   The patient was advised to call back or seek an in-person evaluation if the symptoms worsen or if the condition fails to improve as anticipated.  I provided 15 minutes of non-face-to-face time during this encounter.   [name], PA-C

## 2018-06-27 ENCOUNTER — Encounter: Payer: Self-pay | Admitting: Physician Assistant

## 2018-07-21 ENCOUNTER — Other Ambulatory Visit: Payer: Self-pay | Admitting: Physician Assistant

## 2018-07-21 DIAGNOSIS — F411 Generalized anxiety disorder: Secondary | ICD-10-CM

## 2018-08-26 ENCOUNTER — Encounter: Payer: Self-pay | Admitting: Physician Assistant

## 2018-08-26 DIAGNOSIS — H04123 Dry eye syndrome of bilateral lacrimal glands: Secondary | ICD-10-CM

## 2018-08-26 DIAGNOSIS — L940 Localized scleroderma [morphea]: Secondary | ICD-10-CM

## 2018-08-26 MED ORDER — ERYTHROMYCIN 5 MG/GM OP OINT: 1.0000 "application " | TOPICAL_OINTMENT | Freq: Three times a day (TID) | OPHTHALMIC | 0 refills | Status: AC

## 2018-09-02 DIAGNOSIS — H04129 Dry eye syndrome of unspecified lacrimal gland: Secondary | ICD-10-CM | POA: Insufficient documentation

## 2018-09-02 DIAGNOSIS — L940 Localized scleroderma [morphea]: Secondary | ICD-10-CM | POA: Insufficient documentation

## 2018-09-02 NOTE — Addendum Note (Signed)
Addended by: Donella Stade on: 09/02/2018 04:28 PM   Modules accepted: Orders

## 2018-09-30 NOTE — Progress Notes (Signed)
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 female seen in consultation per request of her PCP.  According to patient her symptoms are started in 2012 with a rash on her back and stomach.  She states at the time she was referred to Dr. Juel Burrow office where she had biopsy and was diagnosed with morphea.  She was referred to Dr. Charlestine Night as her labs showed positive autoimmune antibody.  No treatment was offered at the time.  She states she did well over the few last few years.  In the last few months the rash has spread rapidly over her extremities.  She has noticed rash on her lower extremities and upper extremities.  She is also noticed increased problems with dry eyes in the last few weeks.  She has had recurrent dryness and irritation over her eyelids.  She was recently given and an antibiotic ointment by her PCP which cleared up the irritation.  Patient denies any history of dry mouth, Raynaud's, digital ulcers, dysphagia, skin tightness.  Activities of Daily Living:  Patient reports morning stiffness for 0 none.   Patient Denies nocturnal pain.  Difficulty dressing/grooming: Denies Difficulty climbing stairs: Denies Difficulty getting out of chair: Denies Difficulty using hands for taps, buttons, cutlery, and/or writing: Denies  Review of Systems  Constitutional: Positive for fatigue. Negative for night sweats, weight gain and weight loss.  HENT: Negative for mouth sores, trouble swallowing, trouble swallowing, mouth dryness and nose dryness.   Eyes: Positive for dryness. Negative for pain, redness and visual disturbance.  Respiratory: Negative for cough, shortness of breath and difficulty breathing.    Cardiovascular: Negative for chest pain, palpitations, hypertension, irregular heartbeat and swelling in legs/feet.  Gastrointestinal: Negative for blood in stool, constipation and diarrhea.  Endocrine: Positive for cold intolerance. Negative for increased urination.  Genitourinary: Negative for painful urination and vaginal dryness.  Musculoskeletal: Positive for arthralgias, gait problem, joint pain and joint swelling. Negative for myalgias, muscle weakness, morning stiffness, muscle tenderness and myalgias.  Skin: Positive for rash. Negative for color change, hair loss, skin tightness, ulcers and sensitivity to sunlight.  Allergic/Immunologic: Negative for susceptible to infections.  Neurological: Negative for dizziness, memory loss, night sweats and weakness.  Hematological: Negative for bruising/bleeding tendency and swollen glands.  Psychiatric/Behavioral: Negative for depressed mood and sleep disturbance. The patient is nervous/anxious.     PMFS History:  Patient Active Problem List   Diagnosis Date Noted  . Morphea 09/02/2018  . Chronically dry eyes 09/02/2018  . Gastroesophageal reflux disease with esophagitis 08/14/2016  . Nausea 08/14/2016  . GAD (generalized anxiety disorder) 08/14/2016  . Diarrhea 08/14/2016  . History of Clostridium difficile infection 08/13/2016  . Contraceptive management 04/19/2013  . ALLERGIC RHINITIS 02/17/2006  . ASTHMA 02/17/2006  . CONSTIPATION 02/17/2006  . INFECTION, URINARY TRACT NOS 02/17/2006    Past Medical History:  Diagnosis Date  . Anxiety   . Asthma    as child  . Contraceptive management 04/19/2013    Family History  Problem Relation Age of Onset  . Hyperlipidemia Mother   .  Hypertension Father   . Heart disease Father   . Cancer Father   . Cancer Maternal Uncle   . Diabetes Maternal Uncle   . Diabetes Maternal Grandmother   . Heart disease Maternal Grandmother   . Diabetes Maternal Grandfather   . Stroke Maternal  Grandfather   . Hypertension Paternal Grandmother   . Heart disease Paternal Grandfather   . Hypertension Paternal Grandfather    Past Surgical History:  Procedure Laterality Date  . NASAL SINUS SURGERY    . WISDOM TOOTH EXTRACTION  2006   Social History   Social History Narrative  . Not on file   Immunization History  Administered Date(s) Administered  . Influenza,inj,Quad PF,6+ Mos 12/03/2017  . Influenza-Unspecified 12/17/2012, 12/03/2016     Objective: Vital Signs: BP 109/70 (BP Location: Right Arm, Patient Position: Sitting, Cuff Size: Normal)   Pulse 92   Resp 14   Ht 5' 5.25" (1.657 m)   Wt 145 lb 12.8 oz (66.1 kg)   LMP 10/02/2018   BMI 24.08 kg/m    Physical Exam Vitals signs and nursing note reviewed.  Constitutional:      Appearance: She is well-developed.  HENT:     Head: Normocephalic and atraumatic.  Eyes:     Conjunctiva/sclera: Conjunctivae normal.  Neck:     Musculoskeletal: Normal range of motion.  Cardiovascular:     Rate and Rhythm: Normal rate and regular rhythm.     Heart sounds: Normal heart sounds.  Pulmonary:     Effort: Pulmonary effort is normal.     Breath sounds: Normal breath sounds.  Abdominal:     General: Bowel sounds are normal.     Palpations: Abdomen is soft.  Lymphadenopathy:     Cervical: No cervical adenopathy.  Skin:    General: Skin is warm and dry.     Capillary Refill: Capillary refill takes less than 2 seconds.     Findings: Rash present.     Comments: Hyperpigmented and erythematous rash on left arm, trunk and lower extremities consistent with morphea.  There was no evidence of sclerodactyly or telangiectasias.  She had good capillary refill.  Neurological:     Mental Status: She is alert and oriented to person, place, and time.  Psychiatric:        Behavior: Behavior normal.      Musculoskeletal Exam: C-spine thoracic and lumbar spine with good range of motion.  Shoulder joints, elbow joints, wrist joints,  MCPs PIPs DIPs with good range of motion with no synovitis.  Hip joints knee joints ankles MTPs PIPs DIPs with good range of motion with no synovitis.  CDAI Exam: CDAI Score: - Patient Global: -; Provider Global: - Swollen: -; Tender: - Joint Exam   No joint exam has been documented for this visit   There is currently no information documented on the homunculus. Go to the Rheumatology activity and complete the homunculus joint exam.  Investigation: No additional findings.  Imaging: No results found.  Recent Labs: Lab Results  Component Value Date   WBC 6.2 03/06/2017   HGB 14.0 03/06/2017   PLT 271 03/06/2017   NA 137 03/06/2017   K 4.5 03/06/2017   CL 105 03/06/2017   CO2 22 03/06/2017   GLUCOSE 103 (H) 03/06/2017   BUN 19 03/06/2017   CREATININE 0.83 03/06/2017   BILITOT 0.4 03/06/2017   AST 20 03/06/2017   ALT 11 03/06/2017   PROT 6.4 03/06/2017   CALCIUM 8.8 03/06/2017   GFRAA  02/07/2010    >60        The eGFR has been calculated using the MDRD equation. This calculation has not been validated in all clinical situations. eGFR's persistently <60 mL/min signify possible Chronic Kidney Disease.    Speciality Comments: No specialty comments available.  Procedures:  No procedures performed Allergies: Augmentin [amoxicillin-pot clavulanate], Clindamycin/lincomycin, and Vancomycin   Assessment / Plan:     Visit Diagnoses: Morphea -patient states that she was diagnosed with morphea in 2012 at the time she had a skin biopsy.  No treatment was initiated.  Her rashes is spreading rapidly in the last few months.  I detailed discussion regarding different treatment options for morphea.  Indications side effects contraindications were discussed.  We discussed possible use of methotrexate.  A handout was given.  I will obtain labs today.  Patient is using a contraception and does not want to have children in future.  Plan: Sedimentation rate, ANA, Anti-scleroderma  antibody, RNP Antibody, Anti-Smith antibody, Sjogrens syndrome-A extractable nuclear antibody, Sjogrens syndrome-B extractable nuclear antibody, Anti-DNA antibody, double-stranded, C3 and C4, Beta-2 glycoprotein antibodies, Cardiolipin antibodies, IgG, IgM, IgA, Lupus Anticoagulant Eval w/Reflex, CK,   Chronic dryness of both eyes -patient gives history of dry eyes for several years.  She states her dry eyes are getting worse.  She states she had Sjogren's antibody positive in the past.  We will obtain antibody titers today.  High risk medication use -in the anticipation to start her on immunosuppressive therapy I will obtain following labs today.  Patient also had a chest x-ray in 2018 which was normal.  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  She admits that she eats a lot of carrots.  Gastroesophageal reflux disease with esophagitis -patient states her reflux is not symptomatic now.  History of anxiety -she is on Effexor.  History of asthma -she states she had asthma as a child but is not symptomatic anymore.  History of Clostridium difficile infection - 2015   Orders: Orders Placed This Encounter  Procedures  . CBC with Differential/Platelet  . COMPLETE METABOLIC PANEL WITH GFR  . Sedimentation rate  . ANA  . Anti-scleroderma antibody  . RNP Antibody  . Anti-Smith antibody  . Sjogrens syndrome-A extractable nuclear antibody  . Sjogrens syndrome-B extractable nuclear antibody  . Anti-DNA antibody, double-stranded  . C3 and C4  . Beta-2 glycoprotein antibodies  . Cardiolipin antibodies, IgG, IgM, IgA  . Lupus Anticoagulant Eval w/Reflex  . Hepatitis B  core antibody, IgM  . Hepatitis B surface antigen  . Hepatitis C antibody  . HIV Antibody (routine testing w rflx)  . QuantiFERON-TB Gold Plus  . Serum protein electrophoresis with reflex  . IgG, IgA, IgM  . CK   No orders of the defined types were placed in this encounter.   Face-to-face time spent with patient was 50 minutes. Greater than 50% of time was spent in counseling and coordination of care.  Follow-Up Instructions: Return for Morphea.   Bo Merino, MD  Note - This record has been created using Editor, commissioning.  Chart creation errors have been sought, but may not always  have been located. Such creation errors do not reflect on  the standard of medical care.

## 2018-10-09 ENCOUNTER — Encounter: Payer: Self-pay | Admitting: Rheumatology

## 2018-10-09 ENCOUNTER — Other Ambulatory Visit: Payer: Self-pay

## 2018-10-09 ENCOUNTER — Ambulatory Visit: Payer: 59 | Admitting: Rheumatology

## 2018-10-09 VITALS — BP 109/70 | HR 92 | Resp 14 | Ht 65.25 in | Wt 145.8 lb

## 2018-10-09 DIAGNOSIS — E671 Hypercarotinemia: Secondary | ICD-10-CM | POA: Diagnosis not present

## 2018-10-09 DIAGNOSIS — K2100 Gastro-esophageal reflux disease with esophagitis, without bleeding: Secondary | ICD-10-CM

## 2018-10-09 DIAGNOSIS — Z8659 Personal history of other mental and behavioral disorders: Secondary | ICD-10-CM

## 2018-10-09 DIAGNOSIS — H04123 Dry eye syndrome of bilateral lacrimal glands: Secondary | ICD-10-CM | POA: Diagnosis not present

## 2018-10-09 DIAGNOSIS — Z79899 Other long term (current) drug therapy: Secondary | ICD-10-CM

## 2018-10-09 DIAGNOSIS — Z8619 Personal history of other infectious and parasitic diseases: Secondary | ICD-10-CM

## 2018-10-09 DIAGNOSIS — Z8709 Personal history of other diseases of the respiratory system: Secondary | ICD-10-CM

## 2018-10-09 DIAGNOSIS — L940 Localized scleroderma [morphea]: Secondary | ICD-10-CM

## 2018-10-09 NOTE — Patient Instructions (Signed)
Methotrexate tablets What is this medicine? METHOTREXATE (METH oh TREX ate) is a chemotherapy drug used to treat cancer including breast cancer, leukemia, and lymphoma. This medicine can also be used to treat psoriasis and certain kinds of arthritis. This medicine may be used for other purposes; ask your health care provider or pharmacist if you have questions. COMMON BRAND NAME(S): Rheumatrex, Trexall What should I tell my health care provider before I take this medicine? They need to know if you have any of these conditions:  fluid in the stomach area or lungs  if you often drink alcohol  infection or immune system problems  kidney disease or on hemodialysis  liver disease  low blood counts, like low white cell, platelet, or red cell counts  lung disease  radiation therapy  stomach ulcers  ulcerative colitis  an unusual or allergic reaction to methotrexate, other medicines, foods, dyes, or preservatives  pregnant or trying to get pregnant  breast-feeding How should I use this medicine? Take this medicine by mouth with a glass of water. Follow the directions on the prescription label. Take your medicine at regular intervals. Do not take it more often than directed. Do not stop taking except on your doctor's advice. Make sure you know why you are taking this medicine and how often you should take it. If this medicine is used for a condition that is not cancer, like arthritis or psoriasis, it should be taken weekly, NOT daily. Taking this medicine more often than directed can cause serious side effects, even death. Talk to your healthcare provider about safe handling and disposal of this medicine. You may need to take special precautions. Talk to your pediatrician regarding the use of this medicine in children. While this drug may be prescribed for selected conditions, precautions do apply. Overdosage: If you think you have taken too much of this medicine contact a poison control  center or emergency room at once. NOTE: This medicine is only for you. Do not share this medicine with others. What if I miss a dose? If you miss a dose, talk with your doctor or health care professional. Do not take double or extra doses. What may interact with this medicine? This medicine may interact with the following medication:  acitretin  aspirin and aspirin-like medicines including salicylates  azathioprine  certain antibiotics like penicillins, tetracycline, and chloramphenicol  cyclosporine  gold  hydroxychloroquine  live virus vaccines  NSAIDs, medicines for pain and inflammation, like ibuprofen or naproxen  other cytotoxic agents  penicillamine  phenylbutazone  phenytoin  probenecid  retinoids such as isotretinoin and tretinoin  steroid medicines like prednisone or cortisone  sulfonamides like sulfasalazine and trimethoprim/sulfamethoxazole  theophylline This list may not describe all possible interactions. Give your health care provider a list of all the medicines, herbs, non-prescription drugs, or dietary supplements you use. Also tell them if you smoke, drink alcohol, or use illegal drugs. Some items may interact with your medicine. What should I watch for while using this medicine? Avoid alcoholic drinks. This medicine can make you more sensitive to the sun. Keep out of the sun. If you cannot avoid being in the sun, wear protective clothing and use sunscreen. Do not use sun lamps or tanning beds/booths. You may need blood work done while you are taking this medicine. Call your doctor or health care professional for advice if you get a fever, chills or sore throat, or other symptoms of a cold or flu. Do not treat yourself. This drug decreases your   body's ability to fight infections. Try to avoid being around people who are sick. This medicine may increase your risk to bruise or bleed. Call your doctor or health care professional if you notice any unusual  bleeding. Check with your doctor or health care professional if you get an attack of severe diarrhea, nausea and vomiting, or if you sweat a lot. The loss of too much body fluid can make it dangerous for you to take this medicine. Talk to your doctor about your risk of cancer. You may be more at risk for certain types of cancers if you take this medicine. Both men and women must use effective birth control with this medicine. Do not become pregnant while taking this medicine or until at least 1 normal menstrual cycle has occurred after stopping it. Women should inform their doctor if they wish to become pregnant or think they might be pregnant. Men should not father a child while taking this medicine and for 3 months after stopping it. There is a potential for serious side effects to an unborn child. Talk to your health care professional or pharmacist for more information. Do not breast-feed an infant while taking this medicine. What side effects may I notice from receiving this medicine? Side effects that you should report to your doctor or health care professional as soon as possible:  allergic reactions like skin rash, itching or hives, swelling of the face, lips, or tongue  breathing problems or shortness of breath  diarrhea  dry, nonproductive cough  low blood counts - this medicine may decrease the number of white blood cells, red blood cells and platelets. You may be at increased risk for infections and bleeding.  mouth sores  redness, blistering, peeling or loosening of the skin, including inside the mouth  signs of infection - fever or chills, cough, sore throat, pain or trouble passing urine  signs and symptoms of bleeding such as bloody or black, tarry stools; red or dark-brown urine; spitting up blood or brown material that looks like coffee grounds; red spots on the skin; unusual bruising or bleeding from the eye, gums, or nose  signs and symptoms of kidney injury like trouble  passing urine or change in the amount of urine  signs and symptoms of liver injury like dark yellow or brown urine; general ill feeling or flu-like symptoms; light-colored stools; loss of appetite; nausea; right upper belly pain; unusually weak or tired; yellowing of the eyes or skin Side effects that usually do not require medical attention (report to your doctor or health care professional if they continue or are bothersome):  dizziness  hair loss  tiredness  upset stomach  vomiting This list may not describe all possible side effects. Call your doctor for medical advice about side effects. You may report side effects to FDA at 1-800-FDA-1088. Where should I keep my medicine? Keep out of the reach of children. Store at room temperature between 20 and 25 degrees C (68 and 77 degrees F). Protect from light. Throw away any unused medicine after the expiration date. NOTE: This sheet is a summary. It may not cover all possible information. If you have questions about this medicine, talk to your doctor, pharmacist, or health care provider.  2020 Elsevier/Gold Standard (2016-10-17 13:38:43)  

## 2018-10-13 LAB — LUPUS ANTICOAGULANT EVAL W/ REFLEX
PTT-LA Screen: 31 s (ref <=40)
dRVVT: 45 s (ref <=45)

## 2018-10-13 LAB — IGG, IGA, IGM
IgG (Immunoglobin G), Serum: 1253 mg/dL (ref 600–1640)
IgM, Serum: 186 mg/dL (ref 50–300)
Immunoglobulin A: 133 mg/dL (ref 47–310)

## 2018-10-13 LAB — COMPLETE METABOLIC PANEL WITH GFR
AG Ratio: 1.8 (calc) (ref 1.0–2.5)
ALT: 16 U/L (ref 6–29)
AST: 24 U/L (ref 10–30)
Albumin: 4.7 g/dL (ref 3.6–5.1)
Alkaline phosphatase (APISO): 59 U/L (ref 31–125)
BUN: 21 mg/dL (ref 7–25)
CO2: 27 mmol/L (ref 20–32)
Calcium: 9.8 mg/dL (ref 8.6–10.2)
Chloride: 105 mmol/L (ref 98–110)
Creat: 0.79 mg/dL (ref 0.50–1.10)
GFR, Est African American: 116 mL/min/{1.73_m2} (ref >=60)
GFR, Est Non African American: 100 mL/min/{1.73_m2} (ref >=60)
Globulin: 2.6 g/dL (calc) (ref 1.9–3.7)
Glucose, Bld: 76 mg/dL (ref 65–99)
Potassium: 4.1 mmol/L (ref 3.5–5.3)
Sodium: 139 mmol/L (ref 135–146)
Total Bilirubin: 0.7 mg/dL (ref 0.2–1.2)
Total Protein: 7.3 g/dL (ref 6.1–8.1)

## 2018-10-13 LAB — CBC WITH DIFFERENTIAL/PLATELET
Absolute Monocytes: 329 cells/uL (ref 200–950)
Basophils Absolute: 71 cells/uL (ref 0–200)
Basophils Relative: 1.5 %
Eosinophils Absolute: 132 cells/uL (ref 15–500)
Eosinophils Relative: 2.8 %
HCT: 43.7 % (ref 35.0–45.0)
Hemoglobin: 14.6 g/dL (ref 11.7–15.5)
Lymphs Abs: 1617 cells/uL (ref 850–3900)
MCH: 32.7 pg (ref 27.0–33.0)
MCHC: 33.4 g/dL (ref 32.0–36.0)
MCV: 98 fL (ref 80.0–100.0)
MPV: 10.1 fL (ref 7.5–12.5)
Monocytes Relative: 7 %
Neutro Abs: 2552 cells/uL (ref 1500–7800)
Neutrophils Relative %: 54.3 %
Platelets: 275 10*3/uL (ref 140–400)
RBC: 4.46 10*6/uL (ref 3.80–5.10)
RDW: 11.2 % (ref 11.0–15.0)
Total Lymphocyte: 34.4 %
WBC: 4.7 10*3/uL (ref 3.8–10.8)

## 2018-10-13 LAB — PROTEIN ELECTROPHORESIS, SERUM, WITH REFLEX
Albumin ELP: 4.5 g/dL (ref 3.8–4.8)
Alpha 1: 0.3 g/dL (ref 0.2–0.3)
Alpha 2: 0.6 g/dL (ref 0.5–0.9)
Beta 2: 0.4 g/dL (ref 0.2–0.5)
Beta Globulin: 0.4 g/dL (ref 0.4–0.6)
Gamma Globulin: 1.1 g/dL (ref 0.8–1.7)
Total Protein: 7.3 g/dL (ref 6.1–8.1)

## 2018-10-13 LAB — QUANTIFERON-TB GOLD PLUS
Mitogen-NIL: 9.54 IU/mL
NIL: 0.03 IU/mL
QuantiFERON-TB Gold Plus: NEGATIVE
TB1-NIL: 0 IU/mL
TB2-NIL: <0 IU/mL

## 2018-10-13 LAB — HEPATITIS B CORE ANTIBODY, IGM: Hep B C IgM: NONREACTIVE

## 2018-10-13 LAB — ANTI-DNA ANTIBODY, DOUBLE-STRANDED: ds DNA Ab: <1 IU/mL

## 2018-10-13 LAB — BETA-2 GLYCOPROTEIN ANTIBODIES
Beta-2 Glyco 1 IgA: <9 SAU (ref <=20)
Beta-2 Glyco 1 IgM: <9 SMU (ref <=20)
Beta-2 Glyco I IgG: <9 SGU (ref <=20)

## 2018-10-13 LAB — HEPATITIS C ANTIBODY
Hepatitis C Ab: NONREACTIVE
SIGNAL TO CUT-OFF: 0.01 (ref <1.00)

## 2018-10-13 LAB — RNP ANTIBODY: Ribonucleic Protein(ENA) Antibody, IgG: 1.4 AI — AB

## 2018-10-13 LAB — CARDIOLIPIN ANTIBODIES, IGG, IGM, IGA
Anticardiolipin IgA: <11 [APL'U]
Anticardiolipin IgG: <14 [GPL'U]
Anticardiolipin IgM: <12 [MPL'U]

## 2018-10-13 LAB — HIV ANTIBODY (ROUTINE TESTING W REFLEX): HIV 1&2 Ab, 4th Generation: NONREACTIVE

## 2018-10-13 LAB — ANTI-SMITH ANTIBODY: ENA SM Ab Ser-aCnc: <1 AI

## 2018-10-13 LAB — HEPATITIS B SURFACE ANTIGEN: Hepatitis B Surface Ag: NONREACTIVE

## 2018-10-13 LAB — SEDIMENTATION RATE: Sed Rate: 9 mm/h (ref 0–20)

## 2018-10-13 LAB — CK: Total CK: 87 U/L (ref 29–143)

## 2018-10-13 LAB — C3 AND C4
C3 Complement: 148 mg/dL (ref 83–193)
C4 Complement: 31 mg/dL (ref 15–57)

## 2018-10-13 LAB — ANTI-SCLERODERMA ANTIBODY: Scleroderma (Scl-70) (ENA) Antibody, IgG: <1 AI

## 2018-10-13 LAB — SJOGRENS SYNDROME-B EXTRACTABLE NUCLEAR ANTIBODY: SSB (La) (ENA) Antibody, IgG: <1 AI

## 2018-10-13 LAB — SJOGRENS SYNDROME-A EXTRACTABLE NUCLEAR ANTIBODY: SSA (Ro) (ENA) Antibody, IgG: <1 AI

## 2018-10-13 LAB — ANA: Anti Nuclear Antibody (ANA): NEGATIVE

## 2018-10-13 NOTE — Progress Notes (Signed)
All the labs are within normal limits except for positive RNP.  I will discuss it with the patient at follow-up visit.

## 2018-10-21 ENCOUNTER — Encounter: Payer: Self-pay | Admitting: Rheumatology

## 2018-10-21 NOTE — Telephone Encounter (Signed)
Spoke with patient and advised patient that her lab results would be discussed with her at her new patient follow up visit. Patient rescheduled for a sooner new patient follow up visit to review lab results and go over her concerns.

## 2018-10-22 ENCOUNTER — Ambulatory Visit: Payer: 59 | Admitting: Rheumatology

## 2018-10-22 ENCOUNTER — Other Ambulatory Visit: Payer: Self-pay

## 2018-10-22 ENCOUNTER — Encounter: Payer: Self-pay | Admitting: Rheumatology

## 2018-10-22 VITALS — BP 112/72 | HR 79 | Resp 12 | Ht 65.0 in | Wt 148.4 lb

## 2018-10-22 DIAGNOSIS — Z8709 Personal history of other diseases of the respiratory system: Secondary | ICD-10-CM

## 2018-10-22 DIAGNOSIS — E671 Hypercarotinemia: Secondary | ICD-10-CM

## 2018-10-22 DIAGNOSIS — K2100 Gastro-esophageal reflux disease with esophagitis, without bleeding: Secondary | ICD-10-CM

## 2018-10-22 DIAGNOSIS — Z8659 Personal history of other mental and behavioral disorders: Secondary | ICD-10-CM | POA: Diagnosis not present

## 2018-10-22 DIAGNOSIS — L940 Localized scleroderma [morphea]: Secondary | ICD-10-CM | POA: Diagnosis not present

## 2018-10-22 DIAGNOSIS — H04123 Dry eye syndrome of bilateral lacrimal glands: Secondary | ICD-10-CM | POA: Diagnosis not present

## 2018-10-22 DIAGNOSIS — Z8619 Personal history of other infectious and parasitic diseases: Secondary | ICD-10-CM

## 2018-10-22 NOTE — Progress Notes (Signed)
Office Visit Note  Patient: Heather Pruitt             Date of Birth: 17-Jul-1985           MRN: [id number]             PCP: Lavada Mesi Referring: Donella Stade, PA-C Visit Date: 10/22/2018 Occupation: _0 @  Subjective:  Morphea.   History of Present Illness: Heather Pruitt is a 33 y.o. female with history of morphea.  She states she has not noticed any change in her skin.  She denies any history of Raynauds, telangiectasias or skin tightness.  She denies any shortness of breath or palpitations.  Activities of Daily Living:  Patient reports morning stiffness for 0 minutes.   Patient Denies nocturnal pain.  Difficulty dressing/grooming: Denies Difficulty climbing stairs: Denies Difficulty getting out of chair: Denies Difficulty using hands for taps, buttons, cutlery, and/or writing: Denies  Review of Systems  Constitutional: Positive for fatigue. Negative for night sweats, weight gain and weight loss.  HENT: Negative for mouth sores, trouble swallowing, trouble swallowing, mouth dryness and nose dryness.   Eyes: Positive for dryness. Negative for pain, redness, itching and visual disturbance.  Respiratory: Negative for cough, shortness of breath and difficulty breathing.   Cardiovascular: Negative for chest pain, palpitations, hypertension, irregular heartbeat and swelling in legs/feet.  Gastrointestinal: Negative for abdominal pain, blood in stool, constipation and diarrhea.  Endocrine: Negative for increased urination.  Genitourinary: Negative for painful urination and vaginal dryness.  Musculoskeletal: Negative for arthralgias, joint pain, joint swelling, myalgias, muscle weakness, morning stiffness, muscle tenderness and myalgias.  Skin: Positive for rash. Negative for color change, hair loss, skin tightness, ulcers and sensitivity to sunlight.  Allergic/Immunologic: Negative for susceptible to infections.  Neurological: Negative for dizziness,  light-headedness, numbness, headaches, memory loss, night sweats and weakness.  Hematological: Negative for bruising/bleeding tendency and swollen glands.  Psychiatric/Behavioral: Negative for depressed mood, confusion and sleep disturbance. The patient is nervous/anxious.     PMFS History:  Patient Active Problem List   Diagnosis Date Noted  . Morphea 09/02/2018  . Chronically dry eyes 09/02/2018  . Gastroesophageal reflux disease with esophagitis 08/14/2016  . Nausea 08/14/2016  . GAD (generalized anxiety disorder) 08/14/2016  . Diarrhea 08/14/2016  . History of Clostridium difficile infection 08/13/2016  . Contraceptive management 04/19/2013  . ALLERGIC RHINITIS 02/17/2006  . ASTHMA 02/17/2006  . CONSTIPATION 02/17/2006  . INFECTION, URINARY TRACT NOS 02/17/2006    Past Medical History:  Diagnosis Date  . Anxiety   . Asthma    as child  . Contraceptive management 04/19/2013    Family History  Problem Relation Age of Onset  . Hyperlipidemia Mother   . Hypertension Father   . Heart disease Father   . Cancer Father   . Cancer Maternal Uncle   . Diabetes Maternal Uncle   . Diabetes Maternal Grandmother   . Heart disease Maternal Grandmother   . Diabetes Maternal Grandfather   . Stroke Maternal Grandfather   . Hypertension Paternal Grandmother   . Heart disease Paternal Grandfather   . Hypertension Paternal Grandfather    Past Surgical History:  Procedure Laterality Date  . NASAL SINUS SURGERY    . WISDOM TOOTH EXTRACTION  2006   Social History   Social History Narrative  . Not on file   Immunization History  Administered Date(s) Administered  . Influenza,inj,Quad PF,6+ Mos 12/03/2017  . Influenza-Unspecified 12/17/2012, 12/03/2016     Objective: Vital  Signs: BP 112/72 (BP Location: Right Arm, Patient Position: Sitting, Cuff Size: Normal)   Pulse 79   Resp 12   Ht _0  (1.651 m)   Wt 148 lb 6.4 oz (67.3 kg)   LMP 10/02/2018   BMI 24.70 kg/m     Physical Exam Vitals signs and nursing note reviewed.  Constitutional:      Appearance: She is well-developed.  HENT:     Head: Normocephalic and atraumatic.  Eyes:     Conjunctiva/sclera: Conjunctivae normal.  Neck:     Musculoskeletal: Normal range of motion.  Cardiovascular:     Rate and Rhythm: Normal rate and regular rhythm.     Heart sounds: Normal heart sounds.  Pulmonary:     Effort: Pulmonary effort is normal.     Breath sounds: Normal breath sounds.  Abdominal:     General: Bowel sounds are normal.     Palpations: Abdomen is soft.  Lymphadenopathy:     Cervical: No cervical adenopathy.  Skin:    General: Skin is warm and dry.     Capillary Refill: Capillary refill takes less than 2 seconds.     Comments: Hyperpigmented and erythematous rash on left arm, trunk and lower extremities consistent with morphea.  There was no evidence of sclerodactyly or telangiectasias.  She had good capillary refill.  Neurological:     Mental Status: She is alert and oriented to person, place, and time.  Psychiatric:        Behavior: Behavior normal.      Musculoskeletal Exam: C-spine, shoulders, elbows, wrist joints, MCPs and PIPs been good range of motion with no synovitis.  Hip joints, knee joints, ankles, MTPs and PIPs with good range of motion with no synovitis.  CDAI Exam: CDAI Score: - Patient Global: -; Provider Global: - Swollen: -; Tender: - Joint Exam   No joint exam has been documented for this visit   There is currently no information documented on the homunculus. Go to the Rheumatology activity and complete the homunculus joint exam.  Investigation: No additional findings.  Imaging: No results found.  Recent Labs: Lab Results  Component Value Date   WBC 4.7 10/09/2018   HGB 14.6 10/09/2018   PLT 275 10/09/2018   NA 139 10/09/2018   K 4.1 10/09/2018   CL 105 10/09/2018   CO2 27 10/09/2018   GLUCOSE 76 10/09/2018   BUN 21 10/09/2018   CREATININE 0.79  10/09/2018   BILITOT 0.7 10/09/2018   AST 24 10/09/2018   ALT 16 10/09/2018   PROT 7.3 10/09/2018   PROT 7.3 10/09/2018   CALCIUM 9.8 10/09/2018   GFRAA 116 10/09/2018   QFTBGOLDPLUS NEGATIVE 10/09/2018  October 09, 2018 SPEP normal, CK 87, hepatitis B-, hepatitis C negative, HIV negative, immunoglobulins normal ANA negative, RNP positive (SCL 70, [place], [place], [place], double-stranded DNA negative) anticardiolipin negative beta-2 negative lupus anticoagulant negative, C3-C4 normal, ESR 9  Speciality Comments: No specialty comments available.  Procedures:  No procedures performed Allergies: Augmentin [amoxicillin-pot clavulanate], Clindamycin/lincomycin, and Vancomycin   Assessment / Plan:     Visit Diagnoses: Morphea - ANA negative, RNP positive -I had detailed discussion with patient regarding the use of methotrexate for morphea.  Patient states she has done the reading on methotrexate and at this point especially with the pandemic she does not want to start on any immunosuppressive agents.  I have advised her to monitor and watch for any progression of the skin changes.  I also discussed the significance  of RNP in association of mixed connective tissue disease.  She is been advised to monitor for any skin changes like sclerodactyly, telangiectasia, Raynauds phenomenon and also if she develops any shortness of breath.  She supposed to notify me.  Chronic dryness of both eyes - Plan: Over-the-counter products were discussed.  Gastroesophageal reflux disease with esophagitis -not very symptomatic currently.  History of anxiety -she is on Effexor XR.  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  History of Clostridium difficile infection  Orders: No orders of the defined types were placed in this encounter.  No orders of the defined types were placed in this encounter.     Follow-Up Instructions: Return in about 1  year (around 10/22/2019) for morphea.   Bo Merino, MD  Note - This record has been created using Editor, commissioning.  Chart creation errors have been sought, but may not always  have been located. Such creation errors do not reflect on  the standard of medical care.

## 2018-11-13 ENCOUNTER — Ambulatory Visit: Payer: 59 | Admitting: Rheumatology

## 2018-12-31 ENCOUNTER — Encounter: Payer: Self-pay | Admitting: Physician Assistant

## 2019-01-12 ENCOUNTER — Telehealth (INDEPENDENT_AMBULATORY_CARE_PROVIDER_SITE_OTHER): Payer: 59 | Admitting: Physician Assistant

## 2019-01-12 DIAGNOSIS — F411 Generalized anxiety disorder: Secondary | ICD-10-CM

## 2019-01-12 MED ORDER — VENLAFAXINE HCL ER 150 MG PO CP24: 150.0000 mg | ORAL_CAPSULE | Freq: Every day | ORAL | 1 refills | Status: DC

## 2019-01-12 MED ORDER — ALPRAZOLAM 0.25 MG PO TABS: ORAL_TABLET | ORAL | 1 refills | Status: DC

## 2019-01-12 NOTE — Progress Notes (Signed)
Patient ID: Heather Pruitt, female   DOB: 02-11-1986, 33 y.o.   MRN: [id number] .Marland KitchenVirtual Visit via Video Note  I connected with AMBRI MILTNER on 01/12/19 at  2:00 PM EST by a video enabled telemedicine application and verified that I am speaking with the correct person using two identifiers.  Location: Patient: car Provider: clinic   I discussed the limitations of evaluation and management by telemedicine and the availability of in person appointments. The patient expressed understanding and agreed to proceed.  History of Present Illness: Patient is a 33 year old female with generalized anxiety disorder who calls into the clinic for medication refills.  Patient is doing well on Effexor.  She does not wish to increase.  She admits there has been some increase in anxiety during the Covid pandemic, job losses, marital problems, moving.  She does feel like things are overall getting better.  Her and her husband are going to go to counseling in the next few weeks.  He lost his job and started using her Xanax.  She also found out other things that he was abusing.  He is now getting help for this.  She is now monitoring her medication closer.  She only uses the Xanax very sparingly.  On average 2-3 times a month.   .. Active Ambulatory Problems    Diagnosis Date Noted  . ALLERGIC RHINITIS 02/17/2006  . ASTHMA 02/17/2006  . CONSTIPATION 02/17/2006  . INFECTION, URINARY TRACT NOS 02/17/2006  . Contraceptive management 04/19/2013  . History of Clostridium difficile infection 08/13/2016  . Gastroesophageal reflux disease with esophagitis 08/14/2016  . Nausea 08/14/2016  . GAD (generalized anxiety disorder) 08/14/2016  . Diarrhea 08/14/2016  . Morphea 09/02/2018  . Chronically dry eyes 09/02/2018   Resolved Ambulatory Problems    Diagnosis Date Noted  . No Resolved Ambulatory Problems   Past Medical History:  Diagnosis Date  . Anxiety   . Asthma    Reviewed meds, allergies, problem  list Observations/Objective: No acute distress. Normal mood and appearance.   .. Today's Vitals   01/12/19 1359  Temp: (!) 96.5 F (35.8 C)  TempSrc: Oral  Weight: 140 lb (63.5 kg)  Height: 5\' 5"  (1.651 m)   Body mass index is 23.3 kg/m.  .. Depression screen [facility] 2/9 01/12/2019 06/26/2018 01/21/2017 08/13/2016  Decreased Interest 1 0 1 0  Down, Depressed, Hopeless 1 0 0 0  PHQ - 2 Score 2 0 1 0  Altered sleeping 0 1 0 0  Tired, decreased energy 3 0 2 1  Change in appetite 0 0 0 0  Feeling bad or failure about yourself  0 0 0 0  Trouble concentrating 2 2 2 1   Moving slowly or fidgety/restless 0 0 0 0  Suicidal thoughts 0 0 0 0  PHQ-9 Score 7 3 5 2   Difficult doing work/chores Somewhat difficult Somewhat difficult Not difficult at all -   .. GAD 7 : Generalized Anxiety Score 01/12/2019 06/26/2018 01/21/2017 08/13/2016  Nervous, Anxious, on Edge 2 2 0 0  Control/stop worrying 0 1 0 0  Worry too much - different things 1 1 0 1  Trouble relaxing 0 0 0 0  Restless 0 0 0 0  Easily annoyed or irritable 1 1 1 2   Afraid - awful might happen 0 1 0 1  Total GAD 7 Score 4 6 1 4   Anxiety Difficulty Not difficult at all Not difficult at all Not difficult at all -  Assessment and Plan: Marland KitchenMarland KitchenDiagnoses and all orders for this visit:  GAD (generalized anxiety disorder) -     venlafaxine XR (EFFEXOR-XR) 150 MG 24 hr capsule; Take 1 capsule (150 mg total) by mouth daily with breakfast. -     ALPRAZolam (XANAX) 0.25 MG tablet; take 1 to 2 tablets by mouth if needed for anxiety   PHQ-9 and GAD-7 scores were up a little bit.  There has been some extra stressors due to move in in the Covid pandemic.  She does not want any medication increased today.  Refilled Effexor and Xanax.  Discussed on use Xanax as needed as there is a dependency risk.  Patient is aware to keep this away from her husband since he has shown signs of substance abuse.   Follow Up Instructions:    I discussed the  assessment and treatment plan with the patient. The patient was provided an opportunity to ask questions and all were answered. The patient agreed with the plan and demonstrated an understanding of the instructions.   The patient was advised to call back or seek an in-person evaluation if the symptoms worsen or if the condition fails to improve as anticipated.     [name], PA-C

## 2019-01-12 NOTE — Progress Notes (Signed)
Needs refills medications. PHQ9-GAD7 completed.

## 2019-01-13 ENCOUNTER — Encounter: Payer: Self-pay | Admitting: Physician Assistant

## 2019-01-20 ENCOUNTER — Other Ambulatory Visit: Payer: Self-pay | Admitting: Obstetrics & Gynecology

## 2019-01-29 ENCOUNTER — Other Ambulatory Visit: Payer: Self-pay

## 2019-01-29 DIAGNOSIS — Z20822 Contact with and (suspected) exposure to covid-19: Secondary | ICD-10-CM

## 2019-02-01 LAB — NOVEL CORONAVIRUS, NAA: SARS-CoV-2, NAA: NOT DETECTED

## 2019-06-21 ENCOUNTER — Other Ambulatory Visit: Payer: Self-pay | Admitting: Physician Assistant

## 2019-06-21 DIAGNOSIS — F411 Generalized anxiety disorder: Secondary | ICD-10-CM

## 2019-06-29 ENCOUNTER — Telehealth: Payer: Self-pay | Admitting: Obstetrics & Gynecology

## 2019-06-29 NOTE — Telephone Encounter (Signed)
Left a voicemail message for Heather Pruitt to get her scheduled here if she would like.

## 2019-08-05 ENCOUNTER — Encounter: Payer: Self-pay | Admitting: Physician Assistant

## 2019-08-05 ENCOUNTER — Encounter: Payer: Self-pay | Admitting: Obstetrics & Gynecology

## 2019-08-05 ENCOUNTER — Ambulatory Visit (INDEPENDENT_AMBULATORY_CARE_PROVIDER_SITE_OTHER): Payer: 59 | Admitting: Obstetrics & Gynecology

## 2019-08-05 ENCOUNTER — Other Ambulatory Visit: Payer: Self-pay

## 2019-08-05 VITALS — BP 79/38 | HR 76 | Wt 150.0 lb

## 2019-08-05 DIAGNOSIS — F418 Other specified anxiety disorders: Secondary | ICD-10-CM | POA: Diagnosis not present

## 2019-08-05 DIAGNOSIS — R55 Syncope and collapse: Secondary | ICD-10-CM

## 2019-08-05 DIAGNOSIS — R4589 Other symptoms and signs involving emotional state: Secondary | ICD-10-CM

## 2019-08-05 DIAGNOSIS — Z3009 Encounter for other general counseling and advice on contraception: Secondary | ICD-10-CM | POA: Diagnosis not present

## 2019-08-05 NOTE — Progress Notes (Signed)
   Subjective:    Patient ID: Heather Pruitt, female    DOB: May 17, 1985, 34 y.o.   MRN: [id number]  HPI  34 yo female presents to discuss sterilization.  Pt has never wanted children.  She is married and been with husband since 54 yrs old.  Pt get vagal response when discussing medical issues.  Husband is uninsured.  Before Exam Review of Systems  Constitutional: Negative.   Respiratory: Negative.   Cardiovascular: Negative.   Gastrointestinal: Negative.   Genitourinary: Negative.        Objective:   Physical Exam Vitals reviewed.  Constitutional:      General: She is not in acute distress.    Appearance: She is well-developed.  HENT:     Head: Normocephalic and atraumatic.  Eyes:     Conjunctiva/sclera: Conjunctivae normal.  Cardiovascular:     Rate and Rhythm: Normal rate.  Pulmonary:     Effort: Pulmonary effort is normal.  Abdominal:     General: Abdomen is flat.     Palpations: Abdomen is soft.     Hernia: A hernia is present.     Comments: subcentimeter umbilical hernia  Skin:    General: Skin is warm and dry.  Neurological:     Mental Status: She is alert and oriented to person, place, and time.  Psychiatric:        Mood and Affect: Mood normal.   During abdominal exam pt became hypotensive, lightheaded, dizzy. Vitals:   08/05/19 0839 08/05/19 0940  BP: 112/69 (!) 79/38  Pulse: 80 76  Weight: 150 lb (68 kg)       Assessment & Plan:  34 yo female with undesired fertility. 1.  Pt has extreme vagal response to medical encounters.  Today's vagal response lasting 25+ minutes.   2.  Pt will met with social worker to see if there are community funds for vasectomy for husband.   3.  Will need follow up video visit to see if patient wants to continue with [facility].  40 minutes spent with patient during consultation for [facility], explanation of procedure, alternatives, physician exam and support of vagal episode.

## 2019-08-24 ENCOUNTER — Encounter: Payer: Self-pay | Admitting: Physician Assistant

## 2019-08-24 DIAGNOSIS — F411 Generalized anxiety disorder: Secondary | ICD-10-CM

## 2019-08-25 MED ORDER — VENLAFAXINE HCL ER 150 MG PO CP24: 150.0000 mg | ORAL_CAPSULE | Freq: Every day | ORAL | 0 refills | Status: DC

## 2019-10-01 ENCOUNTER — Other Ambulatory Visit: Payer: Self-pay

## 2019-10-01 ENCOUNTER — Ambulatory Visit (INDEPENDENT_AMBULATORY_CARE_PROVIDER_SITE_OTHER): Payer: 59 | Admitting: Physician Assistant

## 2019-10-01 ENCOUNTER — Encounter: Payer: Self-pay | Admitting: Physician Assistant

## 2019-10-01 VITALS — BP 123/69 | HR 93 | Ht 65.0 in | Wt 149.0 lb

## 2019-10-01 DIAGNOSIS — F411 Generalized anxiety disorder: Secondary | ICD-10-CM | POA: Diagnosis not present

## 2019-10-01 DIAGNOSIS — Z131 Encounter for screening for diabetes mellitus: Secondary | ICD-10-CM | POA: Diagnosis not present

## 2019-10-01 DIAGNOSIS — Z79899 Other long term (current) drug therapy: Secondary | ICD-10-CM | POA: Diagnosis not present

## 2019-10-01 DIAGNOSIS — Z13220 Encounter for screening for lipoid disorders: Secondary | ICD-10-CM

## 2019-10-01 MED ORDER — ALPRAZOLAM 0.25 MG PO TABS: ORAL_TABLET | ORAL | 0 refills | Status: DC

## 2019-10-01 MED ORDER — VENLAFAXINE HCL ER 150 MG PO CP24: 150.0000 mg | ORAL_CAPSULE | Freq: Every day | ORAL | 3 refills | Status: DC

## 2019-10-01 NOTE — Patient Instructions (Signed)
Umbilical Hernia, Adult  A hernia is a bulge of tissue that pushes through an opening between muscles. An umbilical hernia happens in the abdomen, near the belly button (umbilicus). The hernia may contain tissues from the small intestine, large intestine, or fatty tissue covering the intestines (omentum). Umbilical hernias in adults tend to get worse over time, and they require surgical treatment. There are several types of umbilical hernias. You may have:  A hernia located just above or below the umbilicus (indirect hernia). This is the most common type of umbilical hernia in adults.  A hernia that forms through an opening formed by the umbilicus (direct hernia).  A hernia that comes and goes (reducible hernia). A reducible hernia may be visible only when you strain, lift something heavy, or cough. This type of hernia can be pushed back into the abdomen (reduced).  A hernia that traps abdominal tissue inside the hernia (incarcerated hernia). This type of hernia cannot be reduced.  A hernia that cuts off blood flow to the tissues inside the hernia (strangulated hernia). The tissues can start to die if this happens. This type of hernia requires emergency treatment. What are the causes? An umbilical hernia happens when tissue inside the abdomen presses on a weak area of the abdominal muscles. What increases the risk? You may have a greater risk of this condition if you:  Are obese.  Have had several pregnancies.  Have a buildup of fluid inside your abdomen (ascites).  Have had surgery that weakens the abdominal muscles. What are the signs or symptoms? The main symptom of this condition is a painless bulge at or near the belly button. A reducible hernia may be visible only when you strain, lift something heavy, or cough. Other symptoms may include:  Dull pain.  A feeling of pressure. Symptoms of a strangulated hernia may include:  Pain that gets increasingly worse.  Nausea and  vomiting.  Pain when pressing on the hernia.  Skin over the hernia becoming red or purple.  Constipation.  Blood in the stool. How is this diagnosed? This condition may be diagnosed based on:  A physical exam. You may be asked to cough or strain while standing. These actions increase the pressure inside your abdomen and force the hernia through the opening in your muscles. Your health care provider may try to reduce the hernia by pressing on it.  Your symptoms and medical history. How is this treated? Surgery is the only treatment for an umbilical hernia. Surgery for a strangulated hernia is done as soon as possible. If you have a small hernia that is not incarcerated, you may need to lose weight before having surgery. Follow these instructions at home:  Lose weight, if told by your health care provider.  Do not try to push the hernia back in.  Watch your hernia for any changes in color or size. Tell your health care provider if any changes occur.  You may need to avoid activities that increase pressure on your hernia.  Do not lift anything that is heavier than 10 lb (4.5 kg) until your health care provider says that this is safe.  Take over-the-counter and prescription medicines only as told by your health care provider.  Keep all follow-up visits as told by your health care provider. This is important. Contact a health care provider if:  Your hernia gets larger.  Your hernia becomes painful. Get help right away if:  You develop sudden, severe pain near the area of your hernia.    You have pain as well as nausea or vomiting.  You have pain and the skin over your hernia changes color.  You develop a fever. This information is not intended to replace advice given to you by your health care provider. Make sure you discuss any questions you have with your health care provider. Document Revised: 04/09/2017 Document Reviewed: 08/26/2016 Elsevier Patient Education  2020  Elsevier Inc.  

## 2019-10-01 NOTE — Progress Notes (Signed)
Subjective:    Patient ID: Heather Pruitt, female    DOB: 1985/10/05, 34 y.o.   MRN: [id number]  HPI  Pt is a 34 yo female with anxiety who presents to the clinic for medication refills.   She is doing really well with mood. No concerns. No SI/HC. She rarely uses xanax.   Notes mother has uterine cancer.   .. Active Ambulatory Problems    Diagnosis Date Noted  . ALLERGIC RHINITIS 02/17/2006  . ASTHMA 02/17/2006  . CONSTIPATION 02/17/2006  . INFECTION, URINARY TRACT NOS 02/17/2006  . Contraceptive management 04/19/2013  . History of Clostridium difficile infection 08/13/2016  . Gastroesophageal reflux disease with esophagitis 08/14/2016  . Nausea 08/14/2016  . GAD (generalized anxiety disorder) 08/14/2016  . Diarrhea 08/14/2016  . Morphea 09/02/2018  . Chronically dry eyes 09/02/2018   Resolved Ambulatory Problems    Diagnosis Date Noted  . No Resolved Ambulatory Problems   Past Medical History:  Diagnosis Date  . Anxiety   . Asthma       Review of Systems  All other systems reviewed and are negative.      Objective:   Physical Exam Vitals reviewed.  Constitutional:      Appearance: Normal appearance.  HENT:     Head: Normocephalic.     Right Ear: Tympanic membrane normal.     Left Ear: Tympanic membrane normal.  Cardiovascular:     Rate and Rhythm: Normal rate and regular rhythm.     Pulses: Normal pulses.  Pulmonary:     Effort: Pulmonary effort is normal.     Breath sounds: Normal breath sounds.  Neurological:     General: No focal deficit present.     Mental Status: She is alert and oriented to person, place, and time.  Psychiatric:        Mood and Affect: Mood normal.    .. Depression screen [facility] 2/9 10/01/2019 08/05/2019 01/12/2019 06/26/2018 01/21/2017  Decreased Interest 1 1 1  0 1  Down, Depressed, Hopeless 1 1 1  0 0  PHQ - 2 Score 2 2 2  0 1  Altered sleeping 0 1 0 1 0  Tired, decreased energy 2 2 3  0 2  Change in appetite 0 2 0 0 0  Feeling  bad or failure about yourself  0 1 0 0 0  Trouble concentrating 2 0 2 2 2   Moving slowly or fidgety/restless 0 0 0 0 0  Suicidal thoughts 0 0 0 0 0  PHQ-9 Score 6 8 7 3 5   Difficult doing work/chores Somewhat difficult - Somewhat difficult Somewhat difficult Not difficult at all   . GAD 7 : Generalized Anxiety Score 10/01/2019 08/05/2019 01/12/2019 06/26/2018  Nervous, Anxious, on Edge 2 3 2 2   Control/stop worrying 0 1 0 1  Worry too much - different things 1 1 1 1   Trouble relaxing 0 0 0 0  Restless 0 0 0 0  Easily annoyed or irritable 2 2 1 1   Afraid - awful might happen 0 0 0 1  Total GAD 7 Score 5 7 4 6   Anxiety Difficulty Somewhat difficult - Not difficult at all Not difficult at all           Assessment & Plan:  Marland KitchenPansey was seen today for anxiety.  Diagnoses and all orders for this visit:  GAD (generalized anxiety disorder) -     venlafaxine XR (EFFEXOR-XR) 150 MG 24 hr capsule; Take 1 capsule (150 mg total) by mouth  daily with breakfast. -     ALPRAZolam (XANAX) 0.25 MG tablet; take 1 to 2 tablets by mouth if needed for anxiety  Screening for lipid disorders -     Lipid Panel w/reflex Direct LDL  Screening for diabetes mellitus -     COMPLETE METABOLIC PANEL WITH GFR  Medication management -     Lipid Panel w/reflex Direct LDL -     COMPLETE METABOLIC PANEL WITH GFR

## 2019-10-04 ENCOUNTER — Encounter: Payer: Self-pay | Admitting: Physician Assistant

## 2019-10-14 ENCOUNTER — Encounter: Payer: Self-pay | Admitting: Physician Assistant

## 2019-10-21 ENCOUNTER — Ambulatory Visit: Payer: 59 | Admitting: Rheumatology

## 2019-10-27 NOTE — Progress Notes (Deleted)
Office Visit Note  Patient: Heather Pruitt             Date of Birth: [birth year]           MRN: [id number]             PCP: Nolene Ebbs Referring: Nolene Ebbs Visit Date: 11/08/2019 Occupation: @GUAROCC @  Subjective:  No chief complaint on file.   History of Present Illness: Heather Pruitt is a 34 y.o. female ***   Activities of Daily Living:  Patient reports morning stiffness for *** {minute/hour:19697}.   Patient {ACTIONS;DENIES/REPORTS:21021675::"Denies"} nocturnal pain.  Difficulty dressing/grooming: {ACTIONS;DENIES/REPORTS:21021675::"Denies"} Difficulty climbing stairs: {ACTIONS;DENIES/REPORTS:21021675::"Denies"} Difficulty getting out of chair: {ACTIONS;DENIES/REPORTS:21021675::"Denies"} Difficulty using hands for taps, buttons, cutlery, and/or writing: {ACTIONS;DENIES/REPORTS:21021675::"Denies"}  No Rheumatology ROS completed.   PMFS History:  Patient Active Problem List   Diagnosis Date Noted   Morphea 09/02/2018   Chronically dry eyes 09/02/2018   Gastroesophageal reflux disease with esophagitis 08/14/2016   Nausea 08/14/2016   GAD (generalized anxiety disorder) 08/14/2016   Diarrhea 08/14/2016   History of Clostridium difficile infection 08/13/2016   Contraceptive management 04/19/2013   ALLERGIC RHINITIS 02/17/2006   ASTHMA 02/17/2006   CONSTIPATION 02/17/2006   INFECTION, URINARY TRACT NOS 02/17/2006    Past Medical History:  Diagnosis Date   Anxiety    Asthma    as child   Contraceptive management 04/19/2013    Family History  Problem Relation Age of Onset   Hyperlipidemia Mother    Uterine cancer Mother    Hypertension Father    Heart disease Father    Cancer Father        melanoma   Cancer Maternal Uncle    Diabetes Maternal Uncle    Diabetes Maternal Grandmother    Heart disease Maternal Grandmother    Diabetes Maternal Grandfather    Stroke Maternal Grandfather    Hypertension Paternal  Grandmother    Heart disease Paternal Grandfather    Hypertension Paternal Grandfather    Past Surgical History:  Procedure Laterality Date   NASAL SINUS SURGERY     WISDOM TOOTH EXTRACTION  2006   Social History   Social History Narrative   Not on file   Immunization History  Administered Date(s) Administered   Influenza,inj,Quad PF,6+ Mos 12/03/2017, 12/04/2018   Influenza-Unspecified 12/17/2012, 12/03/2016   PFIZER SARS-COV-2 Vaccination 05/15/2019, 06/12/2019     Objective: Vital Signs: There were no vitals taken for this visit.   Physical Exam   Musculoskeletal Exam: ***  CDAI Exam: CDAI Score: -- Patient Global: --; Provider Global: -- Swollen: --; Tender: -- Joint Exam 11/08/2019   No joint exam has been documented for this visit   There is currently no information documented on the homunculus. Go to the Rheumatology activity and complete the homunculus joint exam.  Investigation: No additional findings.  Imaging: No results found.  Recent Labs: Lab Results  Component Value Date   WBC 4.7 10/09/2018   HGB 14.6 10/09/2018   PLT 275 10/09/2018   NA 139 10/09/2018   K 4.1 10/09/2018   CL 105 10/09/2018   CO2 27 10/09/2018   GLUCOSE 76 10/09/2018   BUN 21 10/09/2018   CREATININE 0.79 10/09/2018   BILITOT 0.7 10/09/2018   AST 24 10/09/2018   ALT 16 10/09/2018   PROT 7.3 10/09/2018   PROT 7.3 10/09/2018   CALCIUM 9.8 10/09/2018   GFRAA 116 10/09/2018   QFTBGOLDPLUS NEGATIVE 10/09/2018    Speciality Comments: No specialty  comments available.  Procedures:  No procedures performed Allergies: Augmentin [amoxicillin-pot clavulanate], Clindamycin/lincomycin, and Vancomycin   Assessment / Plan:     Visit Diagnoses: Morphea  Chronic dryness of both eyes  High risk medication use  History of anxiety  History of asthma  History of Clostridium difficile infection  History of gastroesophageal reflux (GERD)  Orders: No orders of the  defined types were placed in this encounter.  No orders of the defined types were placed in this encounter.   Face-to-face time spent with patient was *** minutes. Greater than 50% of time was spent in counseling and coordination of care.  Follow-Up Instructions: No follow-ups on file.   Gearldine Bienenstock, PA-C  Note - This record has been created using Dragon software.  Chart creation errors have been sought, but may not always  have been located. Such creation errors do not reflect on  the standard of medical care.

## 2019-11-05 ENCOUNTER — Telehealth (INDEPENDENT_AMBULATORY_CARE_PROVIDER_SITE_OTHER): Payer: 59 | Admitting: Physician Assistant

## 2019-11-05 ENCOUNTER — Encounter: Payer: Self-pay | Admitting: Physician Assistant

## 2019-11-05 DIAGNOSIS — J40 Bronchitis, not specified as acute or chronic: Secondary | ICD-10-CM

## 2019-11-05 DIAGNOSIS — J329 Chronic sinusitis, unspecified: Secondary | ICD-10-CM

## 2019-11-05 MED ORDER — METHYLPREDNISOLONE 4 MG PO TBPK: ORAL_TABLET | ORAL | 0 refills | Status: DC

## 2019-11-05 MED ORDER — AZITHROMYCIN 250 MG PO TABS: ORAL_TABLET | ORAL | 0 refills | Status: DC

## 2019-11-05 NOTE — Progress Notes (Signed)
Patient ID: Heather Pruitt, female   DOB: [birth year], 34 y.o.   MRN: [id number] .Marland KitchenVirtual Visit via Video Note  I connected with Heather Pruitt on 11/05/2019 at  3:00 PM EDT by a video enabled telemedicine application and verified that I am speaking with the correct person using two identifiers.  Location: Patient: home Provider: clinic   I discussed the limitations of evaluation and management by telemedicine and the availability of in person appointments. The patient expressed understanding and agreed to proceed.  History of Present Illness: Pt is a 34 yo female with sinus pressure, headaches, congestion, cough dry to productive since just before august. She thought was allergies but has been covid tested at least 3 times and negative. She is taking claritin for allergies. No improvement. She continues to have sinus pressure and congestion. No wheezing, SOB, fever, chills, body aches, loss of smell or taste. Using OTC tylenol cold cough severe with no help.   .. Active Ambulatory Problems    Diagnosis Date Noted  . ALLERGIC RHINITIS 02/17/2006  . ASTHMA 02/17/2006  . CONSTIPATION 02/17/2006  . INFECTION, URINARY TRACT NOS 02/17/2006  . Contraceptive management 04/19/2013  . History of Clostridium difficile infection 08/13/2016  . Gastroesophageal reflux disease with esophagitis 08/14/2016  . Nausea 08/14/2016  . GAD (generalized anxiety disorder) 08/14/2016  . Diarrhea 08/14/2016  . Morphea 09/02/2018  . Chronically dry eyes 09/02/2018   Resolved Ambulatory Problems    Diagnosis Date Noted  . No Resolved Ambulatory Problems   Past Medical History:  Diagnosis Date  . Anxiety   . Asthma     REviewed med, allergies, problem list.   Observations/Objective: No acute distress Normal mood and appearance.  Congested. Normal breathing.   [name].There were no vitals filed for this visit. There is no height or weight on file to calculate BMI.     Assessment and Plan: Marland KitchenMarland KitchenDiagnoses and  all orders for this visit:  Sinobronchitis -     azithromycin (ZITHROMAX Z-PAK) 250 MG tablet; Take 2 tablets (500 mg) on  Day 1,  followed by 1 tablet (250 mg) once daily on Days 2 through 5. -     methylPREDNISolone (MEDROL DOSEPAK) 4 MG TBPK tablet; Take as directed by package insert.   Pt has and URI symptoms for almost a month and multiple negative covid test. Suspect secondary bacterial infections at this time. Allergy to PCN sent zpak, flonase and medrol dose pack. Follow up as needed.    Follow Up Instructions:    I discussed the assessment and treatment plan with the patient. The patient was provided an opportunity to ask questions and all were answered. The patient agreed with the plan and demonstrated an understanding of the instructions.   The patient was advised to call back or seek an in-person evaluation if the symptoms worsen or if the condition fails to improve as anticipated.  I provided 7 minutes of non-face-to-face time during this encounter.   [name], PA-C

## 2019-11-05 NOTE — Telephone Encounter (Signed)
Scheduled

## 2019-11-05 NOTE — Telephone Encounter (Signed)
Schedule at 3:00

## 2019-11-08 ENCOUNTER — Encounter: Payer: Self-pay | Admitting: Physician Assistant

## 2019-11-08 ENCOUNTER — Ambulatory Visit: Payer: 59 | Admitting: Physician Assistant

## 2019-11-08 DIAGNOSIS — Z8619 Personal history of other infectious and parasitic diseases: Secondary | ICD-10-CM

## 2019-11-08 DIAGNOSIS — L940 Localized scleroderma [morphea]: Secondary | ICD-10-CM

## 2019-11-08 DIAGNOSIS — Z8659 Personal history of other mental and behavioral disorders: Secondary | ICD-10-CM

## 2019-11-08 DIAGNOSIS — Z8709 Personal history of other diseases of the respiratory system: Secondary | ICD-10-CM

## 2019-11-08 DIAGNOSIS — Z8719 Personal history of other diseases of the digestive system: Secondary | ICD-10-CM

## 2019-11-08 DIAGNOSIS — H04123 Dry eye syndrome of bilateral lacrimal glands: Secondary | ICD-10-CM

## 2019-12-16 ENCOUNTER — Encounter: Payer: Self-pay | Admitting: Physician Assistant

## 2020-01-12 ENCOUNTER — Other Ambulatory Visit: Payer: Self-pay | Admitting: *Deleted

## 2020-01-12 MED ORDER — LOW-OGESTREL 0.3-30 MG-MCG PO TABS: 1.0000 | ORAL_TABLET | Freq: Every day | ORAL | 0 refills | Status: DC

## 2020-01-12 NOTE — Telephone Encounter (Signed)
Pt called requesting 1 RF on her OCP's until her appt with Dr Earlene Plater in Dec for her annual.  1 RF sent to [facility].

## 2020-01-26 ENCOUNTER — Encounter: Payer: Self-pay | Admitting: Physician Assistant

## 2020-01-26 LAB — COMPLETE METABOLIC PANEL WITH GFR
AG Ratio: 1.8 (calc) (ref 1.0–2.5)
ALT: 18 U/L (ref 6–29)
AST: 26 U/L (ref 10–30)
Albumin: 4.3 g/dL (ref 3.6–5.1)
Alkaline phosphatase (APISO): 54 U/L (ref 31–125)
BUN: 18 mg/dL (ref 7–25)
CO2: 24 mmol/L (ref 20–32)
Calcium: 9.2 mg/dL (ref 8.6–10.2)
Chloride: 106 mmol/L (ref 98–110)
Creat: 0.83 mg/dL (ref 0.50–1.10)
GFR, Est African American: 108 mL/min/{1.73_m2} (ref >=60)
GFR, Est Non African American: 93 mL/min/{1.73_m2} (ref >=60)
Globulin: 2.4 g/dL (calc) (ref 1.9–3.7)
Glucose, Bld: 78 mg/dL (ref 65–99)
Potassium: 3.8 mmol/L (ref 3.5–5.3)
Sodium: 139 mmol/L (ref 135–146)
Total Bilirubin: 0.7 mg/dL (ref 0.2–1.2)
Total Protein: 6.7 g/dL (ref 6.1–8.1)

## 2020-01-26 LAB — LIPID PANEL W/REFLEX DIRECT LDL
Cholesterol: 188 mg/dL (ref <200)
HDL: 56 mg/dL (ref >=50)
LDL Cholesterol (Calc): 115 mg/dL (calc) — ABNORMAL HIGH
Non-HDL Cholesterol (Calc): 132 mg/dL (calc) — ABNORMAL HIGH (ref <130)
Total CHOL/HDL Ratio: 3.4 (calc) (ref <5.0)
Triglycerides: 71 mg/dL (ref <150)

## 2020-01-26 NOTE — Progress Notes (Signed)
Responded via mychart

## 2020-02-21 ENCOUNTER — Ambulatory Visit (INDEPENDENT_AMBULATORY_CARE_PROVIDER_SITE_OTHER): Payer: 59 | Admitting: Obstetrics and Gynecology

## 2020-02-21 ENCOUNTER — Other Ambulatory Visit: Payer: Self-pay

## 2020-02-21 ENCOUNTER — Encounter: Payer: Self-pay | Admitting: Obstetrics and Gynecology

## 2020-02-21 ENCOUNTER — Other Ambulatory Visit (HOSPITAL_COMMUNITY)
Admission: RE | Admit: 2020-02-21 | Discharge: 2020-02-21 | Disposition: A | Discharge status: Home / Self Care | Payer: 59 | Source: Ambulatory Visit | Attending: Obstetrics and Gynecology | Admitting: Obstetrics and Gynecology

## 2020-02-21 VITALS — BP 109/70 | HR 66 | Ht 65.0 in | Wt 150.6 lb

## 2020-02-21 DIAGNOSIS — Z124 Encounter for screening for malignant neoplasm of cervix: Secondary | ICD-10-CM | POA: Insufficient documentation

## 2020-02-21 DIAGNOSIS — Z01419 Encounter for gynecological examination (general) (routine) without abnormal findings: Secondary | ICD-10-CM | POA: Diagnosis not present

## 2020-02-21 DIAGNOSIS — N926 Irregular menstruation, unspecified: Secondary | ICD-10-CM | POA: Diagnosis not present

## 2020-02-21 DIAGNOSIS — Z8049 Family history of malignant neoplasm of other genital organs: Secondary | ICD-10-CM

## 2020-02-21 DIAGNOSIS — Z3009 Encounter for other general counseling and advice on contraception: Secondary | ICD-10-CM | POA: Diagnosis not present

## 2020-02-21 MED ORDER — NORETHIN ACE-ETH ESTRAD-FE 1-20 MG-MCG(24) PO TABS: 1.0000 | ORAL_TABLET | Freq: Every day | ORAL | 11 refills | Status: DC

## 2020-02-21 NOTE — Progress Notes (Signed)
GYNECOLOGY ANNUAL PREVENTATIVE CARE ENCOUNTER NOTE  Subjective:   Heather Pruitt is a 34 y.o. G0P0000 female here for a annual gynecologic exam. Current complaints: mom dx with uterine cancer over the summer  Concerned about symptoms of PMDD. Agitation, fatigue and extreme hunger in the week leading up to her period. Has been on loogestrel for about 3 years but doesn't seem to help with her symptoms. Periods are 3-4, light and are not bothersome to her.   Denies abnormal vaginal bleeding, discharge, pelvic pain, problems with intercourse or other gynecologic concerns. Declines STI screen.   Gynecologic History Patient's last menstrual period was 01/26/2020. Contraception: OCP (estrogen/progesterone) Last Pap: 2018. Results: normal Last mammogram: n/a. Results: n/a Gardisil: has not received  Obstetric History OB History  Gravida Para Term Preterm AB Living  0 0 0 0 0 0  SAB IAB Ectopic Multiple Live Births  0 0 0 0      Past Medical History:  Diagnosis Date  . Anxiety   . Asthma    as child  . Contraceptive management 04/19/2013    Past Surgical History:  Procedure Laterality Date  . NASAL SINUS SURGERY    . WISDOM TOOTH EXTRACTION  2006    Current Outpatient Medications on File Prior to Visit  Medication Sig Dispense Refill  . ALPRAZolam (XANAX) 0.25 MG tablet take 1 to 2 tablets by mouth if needed for anxiety 60 tablet 0  . Ascorbic Acid (VITAMIN C) 100 MG tablet Take 100 mg by mouth daily.    . Cholecalciferol (VITAMIN D) 2000 UNITS tablet Take 2,000 Units by mouth daily.    [name] loratadine (CLARITIN) 10 MG tablet Take 10 mg by mouth daily.    . Multiple Vitamin (MULTIVITAMIN) tablet Take 1 tablet by mouth daily.    [name] venlafaxine XR (EFFEXOR-XR) 150 MG 24 hr capsule Take 1 capsule (150 mg total) by mouth daily with breakfast. 90 capsule 3  . azithromycin (ZITHROMAX Z-PAK) 250 MG tablet Take 2 tablets (500 mg) on  Day 1,  followed by 1 tablet (250 mg) once daily on Days 2  through 5. 6 tablet 0  . methylPREDNISolone (MEDROL DOSEPAK) 4 MG TBPK tablet Take as directed by package insert. 21 tablet 0   No current facility-administered medications on file prior to visit.    Allergies  Allergen Reactions  . Augmentin [Amoxicillin-Pot Clavulanate] Diarrhea  . Clindamycin/Lincomycin Other (See Comments)    Got C diff after taking  . Vancomycin Other (See Comments)    Ringing in ears, dizzy    Social History   Socioeconomic History  . Marital status: Married    Spouse name: Not on file  . Number of children: Not on file  . Years of education: Not on file  . Highest education level: Not on file  Occupational History  . Not on file  Tobacco Use  . Smoking status: Former Smoker    Packs/day: 0.01    Years: 1.00    Pack years: 0.01    Types: Cigarettes    Quit date: 03/2009    Years since quitting: 10.9  . Smokeless tobacco: Never Used  Vaping Use  . Vaping Use: Never used  Substance and Sexual Activity  . Alcohol use: Yes    Comment: occassionally  . Drug use: No  . Sexual activity: Yes    Birth control/protection: Pill  Other Topics Concern  . Not on file  Social History Narrative  . Not on file  Social Determinants of Health   Financial Resource Strain: Not on file  Food Insecurity: No Food Insecurity  . Worried About Programme researcher, broadcasting/film/video in the Last Year: Never true  . Ran Out of Food in the Last Year: Never true  Transportation Needs: No Transportation Needs  . Lack of Transportation (Medical): No  . Lack of Transportation (Non-Medical): No  Physical Activity: Not on file  Stress: Not on file  Social Connections: Not on file  Intimate Partner Violence: Not on file    Family History  Problem Relation Age of Onset  . Hyperlipidemia Mother   . Uterine cancer Mother   . Hypertension Father   . Heart disease Father   . Cancer Father        melanoma  . Cancer Maternal Uncle   . Diabetes Maternal Uncle   . Diabetes Maternal  Grandmother   . Heart disease Maternal Grandmother   . Diabetes Maternal Grandfather   . Stroke Maternal Grandfather   . Hypertension Paternal Grandmother   . Heart disease Paternal Grandfather   . Hypertension Paternal Grandfather     The following portions of the patient's history were reviewed and updated as appropriate: allergies, current medications, past family history, past medical history, past social history, past surgical history and problem list.  Review of Systems Pertinent items are noted in HPI.   Objective:  BP 109/70   Pulse 66   Ht 5\' 5"  (1.651 m)   Wt 150 lb 9.6 oz (68.3 kg)   LMP 01/26/2020   BMI 25.06 kg/m  CONSTITUTIONAL: Well-developed, well-nourished female in no acute distress.  HENT:  Normocephalic, atraumatic, External right and left ear normal. Oropharynx is clear and moist EYES: Conjunctivae and EOM are normal. Pupils are equal, round, and reactive to light. No scleral icterus.  NECK: Normal range of motion, supple, no masses.  Normal thyroid.  SKIN: Skin is warm and dry. No rash noted. Not diaphoretic. No erythema. No pallor. NEUROLOGIC: Alert and oriented to person, place, and time. Normal reflexes, muscle tone coordination. No cranial nerve deficit noted. PSYCHIATRIC: Normal mood and affect. Normal behavior. Normal judgment and thought content. CARDIOVASCULAR: Normal heart rate noted RESPIRATORY: Effort normal, no problems with respiration noted. BREASTS: Symmetric in size. No masses, skin changes, nipple drainage, or lymphadenopathy. ABDOMEN: Soft, no distention noted.  No tenderness, rebound or guarding.  PELVIC: Normal appearing external genitalia; normal appearing vaginal mucosa and cervix.  No abnormal discharge noted.  Pap smear obtained. Normal uterine size, no other palpable masses, no uterine or adnexal tenderness. MUSCULOSKELETAL: Normal range of motion. No tenderness.  No cyanosis, clubbing, or edema.  2+ distal pulses.  Exam done with  chaperone present.  Assessment and Plan:   1. Well woman exam Healthy female exam  2. Cervical cancer screening - Cytology - PAP( [place])  3. Encounter for counseling regarding contraception - Would like symptomatic improvement for fatigue/agitation in week prior to periods - Reviewed options for switching contraception to see if she has improved symptom control with another method, reviewed ring, depo, nexplanon, IUD, other OCPs, she would like to try OCPs - Rx sent to pharmacy  4. Menstrual cycle disorder  5. Family history of uterine cancer - Mom dx age 12, no testing that she is aware of - reviewed low likelihood of hereditary cancer, she is interested in genetic screening at some point but not today  Will follow up results of pap smear and manage accordingly. Encouraged improvement in diet and  exercise.  Declines STI screen. COVID vaccine UTD Mammogram n/a Referral for colonoscopy n/a Flu vaccine up to date Gardisil n/a  Routine preventative health maintenance measures emphasized. Please refer to After Visit Summary for other counseling recommendations.   Total face-to-face time with patient: 30 minutes. Over 50% of encounter was spent on counseling and coordination of care.   Baldemar Lenis, M.D. Attending [facility] for [employer] [facility])

## 2020-02-23 LAB — CYTOLOGY - PAP
Comment: NEGATIVE
Diagnosis: UNDETERMINED — AB
High risk HPV: NEGATIVE

## 2020-02-29 ENCOUNTER — Telehealth: Payer: Self-pay

## 2020-02-29 NOTE — Telephone Encounter (Signed)
Called Pt to advise of PAP Smear results & to get a repeat Pap in a year. Patient verbalized understanding.

## 2020-02-29 NOTE — Telephone Encounter (Signed)
-----   Message from Willodean Rosenthal, MD sent at 02/28/2020  3:23 PM EST ----- Please call pt. 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 She needs a repeat PAP in 1 year.   Thx, Clh-S

## 2020-09-08 ENCOUNTER — Telehealth (INDEPENDENT_AMBULATORY_CARE_PROVIDER_SITE_OTHER): Payer: 59 | Admitting: Physician Assistant

## 2020-09-08 DIAGNOSIS — T887XXA Unspecified adverse effect of drug or medicament, initial encounter: Secondary | ICD-10-CM

## 2020-09-08 DIAGNOSIS — R6882 Decreased libido: Secondary | ICD-10-CM | POA: Diagnosis not present

## 2020-09-08 DIAGNOSIS — F411 Generalized anxiety disorder: Secondary | ICD-10-CM

## 2020-09-08 MED ORDER — VENLAFAXINE HCL ER 150 MG PO CP24: 150.0000 mg | ORAL_CAPSULE | Freq: Every day | ORAL | 3 refills | Status: DC

## 2020-09-08 MED ORDER — ALPRAZOLAM 0.25 MG PO TABS: ORAL_TABLET | ORAL | 1 refills | Status: DC

## 2020-09-08 NOTE — Progress Notes (Signed)
Doing well on current regimen she does report that over the past month she has had to use her Xanax more for sleep.

## 2020-09-12 ENCOUNTER — Encounter: Payer: Self-pay | Admitting: Physician Assistant

## 2020-09-12 DIAGNOSIS — T887XXA Unspecified adverse effect of drug or medicament, initial encounter: Secondary | ICD-10-CM | POA: Insufficient documentation

## 2020-09-12 DIAGNOSIS — R6882 Decreased libido: Secondary | ICD-10-CM | POA: Insufficient documentation

## 2020-09-12 NOTE — Progress Notes (Signed)
..Virtual Visit via Video Note  I connected with Heather Pruitt on 09/08/2020 at  4:00 PM EDT by a video enabled telemedicine application and verified that I am speaking with the correct person using two identifiers.  Location: Patient: home Provider: clinic  .Marland KitchenParticipating in visit:  Patient: Heather Pruitt Provider:Ziya Coonrod PA-c   I discussed the limitations of evaluation and management by telemedicine and the availability of in person appointments. The patient expressed understanding and agreed to proceed.  History of Present Illness: Patient is a 35 year old female who presents to the clinic to discuss generalized anxiety disorder and medications.  She is doing great with her mood on Effexor.  She likes the dose and feels very stable.  Unfortunately it has caused any extreme loss libido.  She has no desire for intercourse.  This is posing problems with her husband.  She questions anything she can do about this without changing her medication.  She does not want to change her medication.  She denies any suicidal thoughts or homicidal idealizations.    Active Ambulatory Problems    Diagnosis Date Noted   ALLERGIC RHINITIS 02/17/2006   ASTHMA 02/17/2006   CONSTIPATION 02/17/2006   INFECTION, URINARY TRACT NOS 02/17/2006   Contraceptive management 04/19/2013   History of Clostridium difficile infection 08/13/2016   Gastroesophageal reflux disease with esophagitis 08/14/2016   Nausea 08/14/2016   GAD (generalized anxiety disorder) 08/14/2016   Diarrhea 08/14/2016   Morphea 09/02/2018   Chronically dry eyes 09/02/2018   Low libido 09/12/2020   Medication side effect 09/12/2020   Resolved Ambulatory Problems    Diagnosis Date Noted   No Resolved Ambulatory Problems   Past Medical History:  Diagnosis Date   Anxiety    Asthma     Observations/Objective: No acute distress. Normal mood and appearance.  Normal breathing.   [name].There were no vitals filed for this visit. There is no  height or weight on file to calculate BMI.   .. Depression screen [facility] 2/9 09/08/2020 02/21/2020 10/01/2019 08/05/2019 01/12/2019  Decreased Interest 0 0 1 1 1   Down, Depressed, Hopeless 0 0 1 1 1   PHQ - 2 Score 0 0 2 2 2   Altered sleeping 2 1 0 1 0  Tired, decreased energy 2 1 2 2 3   Change in appetite 2 0 0 2 0  Feeling bad or failure about yourself  0 0 0 1 0  Trouble concentrating 1 0 2 0 2  Moving slowly or fidgety/restless 0 0 0 0 0  Suicidal thoughts 0 0 0 0 0  PHQ-9 Score 7 2 6 8 7   Difficult doing work/chores Somewhat difficult - Somewhat difficult - Somewhat difficult   . GAD 7 : Generalized Anxiety Score 09/08/2020 02/21/2020 10/01/2019 08/05/2019  Nervous, Anxious, on Edge 1 1 2 3   Control/stop worrying 0 0 0 1  Worry too much - different things 1 0 1 1  Trouble relaxing 1 1 0 0  Restless 0 0 0 0  Easily annoyed or irritable 2 2 2 2   Afraid - awful might happen 0 0 0 0  Total GAD 7 Score 5 4 5 7   Anxiety Difficulty Somewhat difficult - Somewhat difficult -     Assessment and Plan: [name]9/1/2022Diagnoses and all orders for this visit:  GAD (generalized anxiety disorder) -     venlafaxine XR (EFFEXOR-XR) 150 MG 24 hr capsule; Take 1 capsule (150 mg total) by mouth daily with breakfast. -     ALPRAZolam (XANAX) 0.25  MG tablet; take 1 to 2 tablets by mouth if needed for anxiety  Low libido  Medication side effect  PHQ/GAD numbers do look good and patient does not want to change medication. She will continue with counseling as well and she finds it very helpful.  Discussed good sleep hygiene. Ok to use xanax as needed for sleep but discussed better options for sleep if finding you are using every night.  Discussed side effect of low libido from medication. Ok to use vibrator and maybe try saffron OTC from libido. Could add wellbutrin to see if would help as well.     Follow Up Instructions:    I discussed the assessment and treatment plan with the patient. The patient was  provided an opportunity to ask questions and all were answered. The patient agreed with the plan and demonstrated an understanding of the instructions.   The patient was advised to call back or seek an in-person evaluation if the symptoms worsen or if the condition fails to improve as anticipated.    [name], PA-C

## 2020-11-06 ENCOUNTER — Telehealth: Payer: Self-pay | Admitting: Neurology

## 2020-11-06 NOTE — Telephone Encounter (Signed)
Left voicemail message for patient to call back to get this virtual visit scheduled. AM 

## 2020-11-06 NOTE — Telephone Encounter (Signed)
Patient left vm wanting to discuss adding medication or changing to help with anxiety which has gotten worse since she unexpectedly separated from her husband.   Please call patient to schedule a virtual visit with Lesly Rubenstein to discuss anxiety medication. [phone]

## 2020-11-08 ENCOUNTER — Other Ambulatory Visit: Payer: Self-pay

## 2020-11-08 ENCOUNTER — Telehealth (INDEPENDENT_AMBULATORY_CARE_PROVIDER_SITE_OTHER): Payer: 59 | Admitting: Physician Assistant

## 2020-11-08 ENCOUNTER — Encounter: Payer: Self-pay | Admitting: Physician Assistant

## 2020-11-08 VITALS — Ht 65.0 in | Wt 150.0 lb

## 2020-11-08 DIAGNOSIS — F411 Generalized anxiety disorder: Secondary | ICD-10-CM | POA: Diagnosis not present

## 2020-11-08 DIAGNOSIS — Z635 Disruption of family by separation and divorce: Secondary | ICD-10-CM | POA: Diagnosis not present

## 2020-11-08 DIAGNOSIS — F410 Panic disorder [episodic paroxysmal anxiety] without agoraphobia: Secondary | ICD-10-CM

## 2020-11-08 MED ORDER — VENLAFAXINE HCL ER 37.5 MG PO CP24: 37.5000 mg | ORAL_CAPSULE | Freq: Every day | ORAL | 1 refills | Status: DC

## 2020-11-08 NOTE — Progress Notes (Signed)
..  Virtual Visit via Video Note  I connected with Heather Pruitt on 11/08/20 at  8:50 AM EDT by a video enabled telemedicine application and verified that I am speaking with the correct person using two identifiers.  Location: Patient: work Provider: clinic  .Marland KitchenParticipating in visit:  Patient: Heather Pruitt Provider: [name] PA-C   I discussed the limitations of evaluation and management by telemedicine and the availability of in person appointments. The patient expressed understanding and agreed to proceed.  History of Present Illness: Pt is a 35 yo female with hx of GAD and panic attacks who presents to the clinic with worsening anxiety and panic. Her husband left her last Thursday. He did this twice last year and she took him back. She is done now. She is having a hard time processing it. No SI/HC. She is on effexor but wonders if needs to be increased. .25 of xanax did not work on Sunday for her panic attacks and had to take 2. She wonders if she can temporarily increase dose.    .. Active Ambulatory Problems    Diagnosis Date Noted   ALLERGIC RHINITIS 02/17/2006   ASTHMA 02/17/2006   CONSTIPATION 02/17/2006   INFECTION, URINARY TRACT NOS 02/17/2006   Contraceptive management 04/19/2013   History of Clostridium difficile infection 08/13/2016   Gastroesophageal reflux disease with esophagitis 08/14/2016   Nausea 08/14/2016   GAD (generalized anxiety disorder) 08/14/2016   Diarrhea 08/14/2016   Morphea 09/02/2018   Chronically dry eyes 09/02/2018   Low libido 09/12/2020   Medication side effect 09/12/2020   Marital conflict involving divorce 11/08/2020   Panic attacks 11/08/2020   Resolved Ambulatory Problems    Diagnosis Date Noted   No Resolved Ambulatory Problems   Past Medical History:  Diagnosis Date   Anxiety    Asthma     Observations/Objective: No acute distress Normal breathing Normal mood and appearance   Assessment and Plan: Marland KitchenMarland KitchenAtleigh was seen today for  anxiety.  Diagnoses and all orders for this visit:  Panic attacks -     venlafaxine XR (EFFEXOR XR) 37.5 MG 24 hr capsule; Take 1 capsule (37.5 mg total) by mouth daily with breakfast.  Marital conflict involving divorce -     venlafaxine XR (EFFEXOR XR) 37.5 MG 24 hr capsule; Take 1 capsule (37.5 mg total) by mouth daily with breakfast.  GAD (generalized anxiety disorder) -     venlafaxine XR (EFFEXOR XR) 37.5 MG 24 hr capsule; Take 1 capsule (37.5 mg total) by mouth daily with breakfast.  Added effexor 37.5 to 150mg  for next 2 months.  Ok to increase xanax 2 of her .25mg  temporarily. When she runs out early ok to refill. Hopefully she will be able to go back to .25mg .  Continue with counseling.  Work on getting good sleep.  Follow up in 2 months.     Follow Up Instructions:    I discussed the assessment and treatment plan with the patient. The patient was provided an opportunity to ask questions and all were answered. The patient agreed with the plan and demonstrated an understanding of the instructions.   The patient was advised to call back or seek an in-person evaluation if the symptoms worsen or if the condition fails to improve as anticipated.   , PA-C

## 2020-12-01 ENCOUNTER — Other Ambulatory Visit: Payer: Self-pay | Admitting: Obstetrics and Gynecology

## 2020-12-14 ENCOUNTER — Encounter: Payer: Self-pay | Admitting: Physician Assistant

## 2020-12-15 MED ORDER — TRAZODONE HCL 50 MG PO TABS: 25.0000 mg | ORAL_TABLET | Freq: Every evening | ORAL | 1 refills | Status: DC | PRN

## 2021-01-16 ENCOUNTER — Other Ambulatory Visit: Payer: Self-pay | Admitting: Physician Assistant

## 2021-01-16 DIAGNOSIS — F411 Generalized anxiety disorder: Secondary | ICD-10-CM

## 2021-01-16 DIAGNOSIS — F410 Panic disorder [episodic paroxysmal anxiety] without agoraphobia: Secondary | ICD-10-CM

## 2021-01-16 DIAGNOSIS — F4325 Adjustment disorder with mixed disturbance of emotions and conduct: Secondary | ICD-10-CM | POA: Diagnosis not present

## 2021-01-16 DIAGNOSIS — Z635 Disruption of family by separation and divorce: Secondary | ICD-10-CM

## 2021-02-07 DIAGNOSIS — J029 Acute pharyngitis, unspecified: Secondary | ICD-10-CM | POA: Diagnosis not present

## 2021-02-07 DIAGNOSIS — R059 Cough, unspecified: Secondary | ICD-10-CM | POA: Diagnosis not present

## 2021-02-08 DIAGNOSIS — F4325 Adjustment disorder with mixed disturbance of emotions and conduct: Secondary | ICD-10-CM | POA: Diagnosis not present

## 2021-02-09 ENCOUNTER — Encounter: Payer: Self-pay | Admitting: Physician Assistant

## 2021-02-09 DIAGNOSIS — F411 Generalized anxiety disorder: Secondary | ICD-10-CM

## 2021-02-09 MED ORDER — ALPRAZOLAM 0.25 MG PO TABS: ORAL_TABLET | ORAL | 1 refills | Status: DC

## 2021-02-09 MED ORDER — TRAZODONE HCL 50 MG PO TABS: 25.0000 mg | ORAL_TABLET | Freq: Every evening | ORAL | 0 refills | Status: DC | PRN

## 2021-02-26 DIAGNOSIS — F4325 Adjustment disorder with mixed disturbance of emotions and conduct: Secondary | ICD-10-CM | POA: Diagnosis not present

## 2021-03-08 ENCOUNTER — Ambulatory Visit: Payer: 59 | Admitting: Family Medicine

## 2021-03-20 DIAGNOSIS — F4325 Adjustment disorder with mixed disturbance of emotions and conduct: Secondary | ICD-10-CM | POA: Diagnosis not present

## 2021-03-22 ENCOUNTER — Other Ambulatory Visit (HOSPITAL_COMMUNITY)
Admission: RE | Admit: 2021-03-22 | Discharge: 2021-03-22 | Disposition: A | Discharge status: Home / Self Care | Payer: BC Managed Care – PPO | Source: Ambulatory Visit | Attending: Family Medicine | Admitting: Family Medicine

## 2021-03-22 ENCOUNTER — Encounter: Payer: Self-pay | Admitting: Family Medicine

## 2021-03-22 ENCOUNTER — Other Ambulatory Visit: Payer: Self-pay

## 2021-03-22 ENCOUNTER — Ambulatory Visit (INDEPENDENT_AMBULATORY_CARE_PROVIDER_SITE_OTHER): Payer: BC Managed Care – PPO | Admitting: Family Medicine

## 2021-03-22 VITALS — BP 137/81 | HR 89 | Ht 64.5 in | Wt 155.5 lb

## 2021-03-22 DIAGNOSIS — Z113 Encounter for screening for infections with a predominantly sexual mode of transmission: Secondary | ICD-10-CM

## 2021-03-22 DIAGNOSIS — Z01419 Encounter for gynecological examination (general) (routine) without abnormal findings: Secondary | ICD-10-CM

## 2021-03-22 DIAGNOSIS — Z124 Encounter for screening for malignant neoplasm of cervix: Secondary | ICD-10-CM | POA: Diagnosis not present

## 2021-03-22 NOTE — Progress Notes (Signed)
Subjective:     Heather Pruitt is a 36 y.o. female and is here for a comprehensive physical exam. The patient reports no problems. Has recently divorced. She is interested in IUD. ON OCs and using condoms.   The following portions of the patient's history were reviewed and updated as appropriate: allergies, current medications, past family history, past medical history, past social history, past surgical history, and problem list.  Review of Systems Pertinent items noted in HPI and remainder of comprehensive ROS otherwise negative.   Objective:    BP 137/81    Pulse 89    Ht 5' 4.5" (1.638 m)    Wt 155 lb 8 oz (70.5 kg)    LMP 02/19/2021 (Exact Date)    BMI 26.28 kg/m  General appearance: alert, cooperative, and appears stated age Head: Normocephalic, without obvious abnormality, atraumatic Neck: no adenopathy, supple, symmetrical, trachea midline, and thyroid not enlarged, symmetric, no tenderness/mass/nodules Lungs: clear to auscultation bilaterally Heart: regular rate and rhythm, S1, S2 normal, no murmur, click, rub or gallop Abdomen: soft, non-tender; bowel sounds normal; no masses,  no organomegaly Pelvic: cervix normal in appearance, external genitalia normal, no adnexal masses or tenderness, no cervical motion tenderness, uterus normal size, shape, and consistency, and vagina normal without discharge Extremities: extremities normal, atraumatic, no cyanosis or edema Pulses: 2+ and symmetric Skin: Skin color, texture, turgor normal. No rashes or lesions Lymph nodes: Cervical, supraclavicular, and axillary nodes normal. Neurologic: Grossly normal    Assessment:    Healthy female exam.      Plan:    Screening for malignant neoplasm of cervix - Plan: Cytology - PAP( [place])  Encounter for gynecological examination without abnormal finding  Screen for STD (sexually transmitted disease) - Plan: Cervicovaginal ancillary only( [place])  Return in 4 weeks (on 04/19/2021) for  IUD insertion.  See After Visit Summary for Counseling Recommendations

## 2021-03-22 NOTE — Patient Instructions (Signed)
Preventive Care 21-34 Years Old, Female °Preventive care refers to lifestyle choices and visits with your health care provider that can promote health and wellness. Preventive care visits are also called wellness exams. °What can I expect for my preventive care visit? °Counseling °During your preventive care visit, your health care provider may ask about your: °Medical history, including: °Past medical problems. °Family medical history. °Pregnancy history. °Current health, including: °Menstrual cycle. °Method of birth control. °Emotional well-being. °Home life and relationship well-being. °Sexual activity and sexual health. °Lifestyle, including: °Alcohol, nicotine or tobacco, and drug use. °Access to firearms. °Diet, exercise, and sleep habits. °Work and work environment. °Sunscreen use. °Safety issues such as seatbelt and bike helmet use. °Physical exam °Your health care provider may check your: °Height and weight. These may be used to calculate your BMI (body mass index). BMI is a measurement that tells if you are at a healthy weight. °Waist circumference. This measures the distance around your waistline. This measurement also tells if you are at a healthy weight and may help predict your risk of certain diseases, such as type 2 diabetes and high blood pressure. °Heart rate and blood pressure. °Body temperature. °Skin for abnormal spots. °What immunizations do I need? °Vaccines are usually given at various ages, according to a schedule. Your health care provider will recommend vaccines for you based on your age, medical history, and lifestyle or other factors, such as travel or where you work. °What tests do I need? °Screening °Your health care provider may recommend screening tests for certain conditions. This may include: °Pelvic exam and Pap test. °Lipid and cholesterol levels. °Diabetes screening. This is done by checking your blood sugar (glucose) after you have not eaten for a while (fasting). °Hepatitis B  test. °Hepatitis C test. °HIV (human immunodeficiency virus) test. °STI (sexually transmitted infection) testing, if you are at risk. °BRCA-related cancer screening. This may be done if you have a family history of breast, ovarian, tubal, or peritoneal cancers. °Talk with your health care provider about your test results, treatment options, and if necessary, the need for more tests. °Follow these instructions at home: °Eating and drinking ° °Eat a healthy diet that includes fresh fruits and vegetables, whole grains, lean protein, and low-fat dairy products. °Take vitamin and mineral supplements as recommended by your health care provider. °Do not drink alcohol if: °Your health care provider tells you not to drink. °You are pregnant, may be pregnant, or are planning to become pregnant. °If you drink alcohol: °Limit how much you have to 0-1 drink a day. °Know how much alcohol is in your drink. In the [place] one drink equals one 12 oz bottle of beer (355 mL), one 5 oz glass of wine (148 mL), or one 1½ oz glass of hard liquor (44 mL). °Lifestyle °Brush your teeth every morning and night with fluoride toothpaste. Floss one time each day. °Exercise for at least 30 minutes 5 or more days each week. °Do not use any products that contain nicotine or tobacco. These products include cigarettes, chewing tobacco, and vaping devices, such as e-cigarettes. If you need help quitting, ask your health care provider. °Do not use drugs. °If you are sexually active, practice safe sex. Use a condom or other form of protection to prevent STIs. °If you do not wish to become pregnant, use a form of birth control. If you plan to become pregnant, see your health care provider for a prepregnancy visit. °Find healthy ways to manage stress, such as: °Meditation, yoga,   or listening to music. °Journaling. °Talking to a trusted person. °Spending time with friends and family. °Minimize exposure to UV radiation to reduce your risk of skin  cancer. °Safety °Always wear your seat belt while driving or riding in a vehicle. °Do not drive: °If you have been drinking alcohol. Do not ride with someone who has been drinking. °If you have been using any mind-altering substances or drugs. °While texting. °When you are tired or distracted. °Wear a helmet and other protective equipment during sports activities. °If you have firearms in your house, make sure you follow all gun safety procedures. °Seek help if you have been physically or sexually abused. °What's next? °Go to your health care provider once a year for an annual wellness visit. °Ask your health care provider how often you should have your eyes and teeth checked. °Stay up to date on all vaccines. °This information is not intended to replace advice given to you by your health care provider. Make sure you discuss any questions you have with your health care provider. °Document Revised: 08/23/2020 Document Reviewed: 08/23/2020 °Elsevier Patient Education © 2022 [employer] Inc. ° °

## 2021-03-26 LAB — CYTOLOGY - PAP
Comment: NEGATIVE
Diagnosis: NEGATIVE
High risk HPV: NEGATIVE

## 2021-03-27 LAB — CERVICOVAGINAL ANCILLARY ONLY
Bacterial Vaginitis (gardnerella): NEGATIVE
Candida Glabrata: NEGATIVE
Candida Vaginitis: POSITIVE — AB
Chlamydia: NEGATIVE
Comment: NEGATIVE
Comment: NEGATIVE
Comment: NEGATIVE
Comment: NEGATIVE
Comment: NEGATIVE
Trichomonas: NEGATIVE

## 2021-03-28 MED ORDER — FLUCONAZOLE 150 MG PO TABS: 150.0000 mg | ORAL_TABLET | Freq: Every day | ORAL | 2 refills | Status: DC

## 2021-03-28 NOTE — Addendum Note (Signed)
Addended by: Reva Bores on: 03/28/2021 08:20 AM   Modules accepted: Orders

## 2021-04-18 DIAGNOSIS — F4325 Adjustment disorder with mixed disturbance of emotions and conduct: Secondary | ICD-10-CM | POA: Diagnosis not present

## 2021-05-16 DIAGNOSIS — F4325 Adjustment disorder with mixed disturbance of emotions and conduct: Secondary | ICD-10-CM | POA: Diagnosis not present

## 2021-05-16 DIAGNOSIS — Z630 Problems in relationship with spouse or partner: Secondary | ICD-10-CM | POA: Diagnosis not present

## 2021-05-18 ENCOUNTER — Ambulatory Visit: Payer: BC Managed Care – PPO | Admitting: Medical

## 2021-05-30 ENCOUNTER — Ambulatory Visit: Payer: BC Managed Care – PPO | Admitting: Obstetrics and Gynecology

## 2021-05-30 ENCOUNTER — Encounter: Payer: Self-pay | Admitting: Obstetrics and Gynecology

## 2021-05-30 ENCOUNTER — Other Ambulatory Visit: Payer: Self-pay

## 2021-05-30 VITALS — BP 127/80 | HR 73 | Wt 155.9 lb

## 2021-05-30 DIAGNOSIS — Z3009 Encounter for other general counseling and advice on contraception: Secondary | ICD-10-CM

## 2021-05-30 NOTE — Progress Notes (Signed)
?CC: tubal liagtion consult ?Subjective:  ? ? Patient ID: Heather Pruitt, female    DOB: September 04, 1985, 36 y.o.   MRN: [id number] ? ?HPI ?36 yo G0P0 seen for tubal consultation. ? ?Patient desires permanent sterilization.  Other reversible forms of contraception (over the counter/barrier methods; hormonal contraceptives including pill, patch, ring, Depo-Provera injection, Nexplanon implant; hormonal IUDs Skyla and Mirena; nonhormonal copper IUD Paragard) were discussed with patient; she declined all these modalities. Also discussed the option of vasectomy for her female partner; she also declined this option. ?She was given the choice between laparoscopic bilateral tubal sterilization using Filshie clips or laparoscopic bilateral salpingectomy. For the Filshie clip sterilization, she was told she will have one incision in her umbilicus.  Failure risk of 1-2 % with increased risk of ectopic gestation if pregnancy occurs was also discussed with patient. For the bilateral salpingectomy, she was told that both tubes will be resected via three small incisions; the failure risk of less than 1%.  Any future pregnancies will have to be attempted via IVF or other fertility procedures.  Reiterated permanence and irreversibility of both procedures; in the case of Filshie clip application, attempts to reverse tubal sterilization are often not successful.  Also emphasized risk of regret which is noted more in patients less than the age of 64. This was strongly emphasized due to her nulliparity.  Pt is adamant that she does not want children.   All questions were answered. She desires laparoscopic bilateral salpingectomy.  Other risks of the procedure were discussed with patient including but not limited to: bleeding, infection, injury to surrounding organs and need for additional procedures.  Also discussed possibility of post-tubal pain syndrome. Patient verbalized understanding of these risks and wants to proceed with this  procedure.  She was told that she will be contacted by our surgical scheduler regarding the time and date of her surgery; routine preoperative instructions of having nothing to eat or drink after midnight on the day prior to surgery and also coming to the hospital 1.5 hours prior to her time of surgery were also emphasized.  She was told she may be called for a preoperative appointment about a week prior to surgery and will be given further preoperative instructions at that visit. Printed patient education handouts about the procedure were given to the patient to review at home. Pt has private insurance. ? ? ? ?Review of Systems ? ?   ?Objective:  ? Physical Exam ?Vitals:  ? 05/30/21 0955  ?BP: 127/80  ?Pulse: 73  ?Abd: cursory exam of abdomen showed no surgical scar and normal umbilicus ? ?Past Surgical History:  ?Procedure Laterality Date  ? NASAL SINUS SURGERY    ? WISDOM TOOTH EXTRACTION  2006  ?  ?Past Medical History:  ?Diagnosis Date  ? Anxiety   ? Asthma   ? as child  ? Contraceptive management 04/19/2013  ?  ?Social History  ? ?Socioeconomic History  ? Marital status: Legally Separated  ?  Spouse name: Not on file  ? Number of children: Not on file  ? Years of education: Not on file  ? Highest education level: Not on file  ?Occupational History  ? Not on file  ?Tobacco Use  ? Smoking status: Former  ?  Packs/day: 0.01  ?  Years: 1.00  ?  Pack years: 0.01  ?  Types: Cigarettes  ?  Quit date: 03/2009  ?  Years since quitting: 12.2  ? Smokeless tobacco: Never  ?Vaping Use  ?  Vaping Use: Never used  ?Substance and Sexual Activity  ? Alcohol use: Yes  ?  Comment: occassionally  ? Drug use: No  ? Sexual activity: Yes  ?  Birth control/protection: Pill  ?Other Topics Concern  ? Not on file  ?Social History Narrative  ? Not on file  ? ?Social Determinants of Health  ? ?Financial Resource Strain: Not on file  ?Food Insecurity: Not on file  ?Transportation Needs: Not on file  ?Physical Activity: Not on file  ?Stress:  Not on file  ?Social Connections: Not on file  ?Intimate Partner Violence: Not on file  ?  ? ? ? ?   ?Assessment & Plan:  ? ?1. Consultation for female sterilization ?I spent 10 minutes dedicated to the care of this patient including previsit review of records, face to face time with the patient discussing plan of care and post visit testing.  ?Mariel Aloe, MD, FACOG ? ? ? ?Warden Fillers, MD ?Faculty Attending, [facility]  ?

## 2021-05-31 ENCOUNTER — Encounter: Payer: Self-pay | Admitting: *Deleted

## 2021-06-01 DIAGNOSIS — N921 Excessive and frequent menstruation with irregular cycle: Secondary | ICD-10-CM | POA: Diagnosis not present

## 2021-06-01 DIAGNOSIS — R350 Frequency of micturition: Secondary | ICD-10-CM | POA: Diagnosis not present

## 2021-06-04 ENCOUNTER — Encounter: Payer: Self-pay | Admitting: Obstetrics and Gynecology

## 2021-06-19 ENCOUNTER — Telehealth: Payer: Self-pay | Admitting: Family Medicine

## 2021-06-19 NOTE — Telephone Encounter (Signed)
Called patient to tell her about her post-op appointment, there was no answer to the phone call so a voicemail was left and a letter was mailed.  ?

## 2021-06-20 ENCOUNTER — Ambulatory Visit: Payer: BC Managed Care – PPO | Admitting: Obstetrics and Gynecology

## 2021-06-20 ENCOUNTER — Encounter: Payer: Self-pay | Admitting: Obstetrics and Gynecology

## 2021-06-20 VITALS — BP 128/80 | HR 95 | Wt 159.6 lb

## 2021-06-20 DIAGNOSIS — Z01818 Encounter for other preprocedural examination: Secondary | ICD-10-CM | POA: Diagnosis not present

## 2021-06-20 NOTE — Progress Notes (Signed)
?OB/GYN Pre-Op History and Physical ? ?Heather Pruitt is a 36 y.o. G0P0000 presenting for preoperative appointment for laparoscopic bilateral salpingectomy.  We reviewed the previous visit and she still wishes to have permanent sterilization even though she has had no children.  She is aware of risks and benefits of the procedure including bleeding, infection, involvement of other organs including bladder and bowel.  She is aware of risk of regret as well as other non permananet forms of contraception. ? ?  ? ? ?Past Medical History:  ?Diagnosis Date  ? Anxiety   ? Asthma   ? as child  ? Contraceptive management 04/19/2013  ? ? ?Past Surgical History:  ?Procedure Laterality Date  ? NASAL SINUS SURGERY    ? WISDOM TOOTH EXTRACTION  2006  ? ? ?OB History  ?Gravida Para Term Preterm AB Living  ?0 0 0 0 0 0  ?SAB IAB Ectopic Multiple Live Births  ?0 0 0 0    ? ? ?Social History  ? ?Socioeconomic History  ? Marital status: Legally Separated  ?  Spouse name: Not on file  ? Number of children: Not on file  ? Years of education: Not on file  ? Highest education level: Not on file  ?Occupational History  ? Not on file  ?Tobacco Use  ? Smoking status: Former  ?  Packs/day: 0.01  ?  Years: 1.00  ?  Pack years: 0.01  ?  Types: Cigarettes  ?  Quit date: 03/2009  ?  Years since quitting: 12.2  ? Smokeless tobacco: Never  ?Vaping Use  ? Vaping Use: Never used  ?Substance and Sexual Activity  ? Alcohol use: Yes  ?  Comment: occassionally  ? Drug use: No  ? Sexual activity: Yes  ?  Birth control/protection: Pill  ?Other Topics Concern  ? Not on file  ?Social History Narrative  ? Not on file  ? ?Social Determinants of Health  ? ?Financial Resource Strain: Not on file  ?Food Insecurity: Not on file  ?Transportation Needs: Not on file  ?Physical Activity: Not on file  ?Stress: Not on file  ?Social Connections: Not on file  ? ? ?Family History  ?Problem Relation Age of Onset  ? Heart disease Paternal Grandfather   ? Hypertension Paternal  Grandfather   ? Hypertension Paternal Grandmother   ? Diabetes Maternal Grandmother   ? Heart disease Maternal Grandmother   ? Diabetes Maternal Grandfather   ? Stroke Maternal Grandfather   ? Hypertension Father   ? Heart disease Father   ? Cancer Father   ?     melanoma  ? Hyperlipidemia Mother   ? Uterine cancer Mother   ? Cancer Maternal Uncle   ?     Multiple myeloma and Leukemia  ? Diabetes Maternal Uncle   ? ? ?(Not in a hospital admission) ? ? ?Allergies  ?Allergen Reactions  ? Augmentin [Amoxicillin-Pot Clavulanate] Diarrhea  ? Clindamycin/Lincomycin Other (See Comments)  ?  Got C diff after taking  ? Vancomycin Other (See Comments)  ?  Ringing in ears, dizzy  ? ? ?Review of Systems: Negative except for what is mentioned in HPI. ? ?  ? ?Physical Exam: ?BP 128/80   Pulse 95   Wt 159 lb 9.6 oz (72.4 kg)   LMP 06/16/2021 (Exact Date)   BMI 26.97 kg/m?  ?CONSTITUTIONAL: Well-developed, well-nourished female in no acute distress.  ?HENT:  Normocephalic, atraumatic, External right and left ear normal. Oropharynx is clear and moist ?  EYES: Conjunctivae and EOM are normal.  ?NECK: Normal range of motion, supple, no masses ?SKIN: Skin is warm and dry. No rash noted. Not diaphoretic. No erythema. No pallor. ?NEUROLGIC: Alert and oriented to person, place, and time. Normal reflexes, muscle tone coordination. No cranial nerve deficit noted. ?PSYCHIATRIC: Normal mood and affect. Normal behavior. Normal judgment and thought content. ?CARDIOVASCULAR: Normal heart rate noted, regular rhythm ?RESPIRATORY: Effort and breath sounds normal, no problems with respiration noted ?ABDOMEN: Soft, nontender, nondistended,  ?PELVIC: Deferred ?MUSCULOSKELETAL: Normal range of motion. No edema and no tenderness. 2+ distal pulses. ? ? ?Pertinent Labs/Studies:   ?No results found for this or any previous visit (from the past 72 hour(s)). ? ?  ? ?Assessment and Plan :Heather Pruitt is a 36 y.o. G0P0000 here for preoperative examination  for laparoscopic salpingectomy.  She is again aware of the risk and benefits of the procedure. ? ? ?Plan for laparoscopic bilateral salpingectomy ?NPO ?Admission labs ordered ?VS Q4 ? ? ?Gwendolyne Welford, M.D. ?Attending Obstetrician & Gynecologist, Faculty Practice ?[facility]  ?

## 2021-06-20 NOTE — H&P (View-Only) (Signed)
?OB/GYN Pre-Op History and Physical ? ?Heather Pruitt is a 36 y.o. G0P0000 presenting for preoperative appointment for laparoscopic bilateral salpingectomy.  We reviewed the previous visit and she still wishes to have permanent sterilization even though she has had no children.  She is aware of risks and benefits of the procedure including bleeding, infection, involvement of other organs including bladder and bowel.  She is aware of risk of regret as well as other non permananet forms of contraception. ? ?  ? ? ?Past Medical History:  ?Diagnosis Date  ? Anxiety   ? Asthma   ? as child  ? Contraceptive management 04/19/2013  ? ? ?Past Surgical History:  ?Procedure Laterality Date  ? NASAL SINUS SURGERY    ? [place] EXTRACTION  2006  ? ? ?OB History  ?Gravida Para Term Preterm AB Living  ?0 0 0 0 0 0  ?SAB IAB Ectopic Multiple Live Births  ?0 0 0 0    ? ? ?Social History  ? ?Socioeconomic History  ? Marital status: Legally Separated  ?  Spouse name: Not on file  ? Number of children: Not on file  ? Years of education: Not on file  ? Highest education level: Not on file  ?Occupational History  ? Not on file  ?Tobacco Use  ? Smoking status: Former  ?  Packs/day: 0.01  ?  Years: 1.00  ?  Pack years: 0.01  ?  Types: Cigarettes  ?  Quit date: 03/2009  ?  Years since quitting: 12.2  ? Smokeless tobacco: Never  ?Vaping Use  ? Vaping Use: Never used  ?Substance and Sexual Activity  ? Alcohol use: Yes  ?  Comment: occassionally  ? Drug use: No  ? Sexual activity: Yes  ?  Birth control/protection: Pill  ?Other Topics Concern  ? Not on file  ?Social History Narrative  ? Not on file  ? ?Social Determinants of Health  ? ?Financial Resource Strain: Not on file  ?Food Insecurity: Not on file  ?Transportation Needs: Not on file  ?Physical Activity: Not on file  ?Stress: Not on file  ?Social Connections: Not on file  ? ? ?Family History  ?Problem Relation Age of Onset  ? Heart disease Paternal Grandfather   ? Hypertension Paternal  Grandfather   ? Hypertension Paternal Grandmother   ? Diabetes Maternal Grandmother   ? Heart disease Maternal Grandmother   ? Diabetes Maternal Grandfather   ? Stroke Maternal Grandfather   ? Hypertension Father   ? Heart disease Father   ? Cancer Father   ?     melanoma  ? Hyperlipidemia Mother   ? Uterine cancer Mother   ? Cancer Maternal Uncle   ?     Multiple myeloma and Leukemia  ? Diabetes Maternal Uncle   ? ? ?(Not in a hospital admission) ? ? ?Allergies  ?Allergen Reactions  ? Augmentin [Amoxicillin-Pot Clavulanate] Diarrhea  ? Clindamycin/Lincomycin Other (See Comments)  ?  Got C diff after taking  ? Vancomycin Other (See Comments)  ?  Ringing in ears, dizzy  ? ? ?Review of Systems: Negative except for what is mentioned in HPI. ? ?  ? ?Physical Exam: ?BP 128/80   Pulse 95   Wt 159 lb 9.6 oz (72.4 kg)   LMP 06/16/2021 (Exact Date)   BMI 26.97 kg/m?  ?CONSTITUTIONAL: Well-developed, well-nourished female in no acute distress.  ?HENT:  Normocephalic, atraumatic, External right and left ear normal. Oropharynx is clear and moist ?  EYES: Conjunctivae and EOM are normal.  ?NECK: Normal range of motion, supple, no masses ?SKIN: Skin is warm and dry. No rash noted. Not diaphoretic. No erythema. No pallor. ?[place]: Alert and oriented to person, place, and time. Normal reflexes, muscle tone coordination. No cranial nerve deficit noted. ?PSYCHIATRIC: Normal mood and affect. Normal behavior. Normal judgment and thought content. ?CARDIOVASCULAR: Normal heart rate noted, regular rhythm ?RESPIRATORY: Effort and breath sounds normal, no problems with respiration noted ?ABDOMEN: Soft, nontender, nondistended,  ?PELVIC: Deferred ?MUSCULOSKELETAL: Normal range of motion. No edema and no tenderness. 2+ distal pulses. ? ? ?Pertinent Labs/Studies:   ?No results found for this or any previous visit (from the past 72 hour(s)). ? ?  ? ?Assessment and Plan :Heather Pruitt is a 36 y.o. G0P0000 here for preoperative examination  for laparoscopic salpingectomy.  She is again aware of the risk and benefits of the procedure. ? ? ?Plan for laparoscopic bilateral salpingectomy ?NPO ?Admission labs ordered ?VS Q4 ? ? ?Lynnda Shields, M.D. ?Attending [place], Faculty Practice ?Center for [place]  ?

## 2021-06-26 ENCOUNTER — Ambulatory Visit: Payer: BC Managed Care – PPO | Admitting: Obstetrics and Gynecology

## 2021-06-27 DIAGNOSIS — Z630 Problems in relationship with spouse or partner: Secondary | ICD-10-CM | POA: Diagnosis not present

## 2021-06-27 DIAGNOSIS — F4325 Adjustment disorder with mixed disturbance of emotions and conduct: Secondary | ICD-10-CM | POA: Diagnosis not present

## 2021-06-29 ENCOUNTER — Other Ambulatory Visit: Payer: Self-pay

## 2021-06-29 ENCOUNTER — Encounter (HOSPITAL_BASED_OUTPATIENT_CLINIC_OR_DEPARTMENT_OTHER): Payer: Self-pay | Admitting: Obstetrics and Gynecology

## 2021-06-29 NOTE — Progress Notes (Signed)
Spoke w/ via phone for pre-op interview--- pt ?Lab needs dos---- cbc, t&s, urine preg              ?Lab results------ no ?COVID test -----patient states asymptomatic no test needed ?Arrive at ------- 1000 on 07-03-2021 ?NPO after MN NO Solid Food.  Clear liquids from MN until--- 0900 ?Med rec completed ?Medications to take morning of surgery ----- if needed take xanax ?Diabetic medication ----- n/a ?Patient instructed no nail polish to be worn day of surgery ?Patient instructed to bring photo id and insurance card day of surgery ?Patient aware to have Driver (ride ) / caregiver     for 24 hours after surgery -- partner, shane ?Patient Special Instructions ----- n/a ?Pre-Op special Istructions ----- n/a ?Patient verbalized understanding of instructions that were given at this phone interview. ?Patient denies shortness of breath, chest pain, fever, cough at this phone interview.  ?

## 2021-07-03 ENCOUNTER — Encounter (HOSPITAL_BASED_OUTPATIENT_CLINIC_OR_DEPARTMENT_OTHER): Payer: Self-pay | Admitting: Obstetrics and Gynecology

## 2021-07-03 ENCOUNTER — Ambulatory Visit (HOSPITAL_BASED_OUTPATIENT_CLINIC_OR_DEPARTMENT_OTHER): Payer: BC Managed Care – PPO | Admitting: Certified Registered"

## 2021-07-03 ENCOUNTER — Encounter (HOSPITAL_BASED_OUTPATIENT_CLINIC_OR_DEPARTMENT_OTHER)
Admission: RE | Disposition: A | Discharge status: Home / Self Care | Payer: Self-pay | Source: Home / Self Care | Attending: Obstetrics and Gynecology

## 2021-07-03 ENCOUNTER — Other Ambulatory Visit: Payer: Self-pay

## 2021-07-03 ENCOUNTER — Ambulatory Visit (HOSPITAL_BASED_OUTPATIENT_CLINIC_OR_DEPARTMENT_OTHER)
Admission: RE | Admit: 2021-07-03 | Discharge: 2021-07-03 | Disposition: A | Discharge status: Home / Self Care | Payer: BC Managed Care – PPO | Attending: Obstetrics and Gynecology | Admitting: Obstetrics and Gynecology

## 2021-07-03 DIAGNOSIS — Z302 Encounter for sterilization: Secondary | ICD-10-CM

## 2021-07-03 DIAGNOSIS — Z3009 Encounter for other general counseling and advice on contraception: Secondary | ICD-10-CM | POA: Diagnosis not present

## 2021-07-03 DIAGNOSIS — Z87891 Personal history of nicotine dependence: Secondary | ICD-10-CM | POA: Insufficient documentation

## 2021-07-03 DIAGNOSIS — Z01818 Encounter for other preprocedural examination: Secondary | ICD-10-CM

## 2021-07-03 HISTORY — DX: Generalized anxiety disorder: F41.1

## 2021-07-03 HISTORY — DX: Dry eye syndrome of unspecified lacrimal gland: H04.129

## 2021-07-03 HISTORY — DX: Presence of spectacles and contact lenses: Z97.3

## 2021-07-03 HISTORY — DX: Localized scleroderma (morphea): L94.0

## 2021-07-03 HISTORY — PX: LAPAROSCOPIC TUBAL LIGATION: SHX1937

## 2021-07-03 HISTORY — DX: Personal history of other mental and behavioral disorders: Z86.59

## 2021-07-03 HISTORY — DX: Other seasonal allergic rhinitis: J30.2

## 2021-07-03 HISTORY — DX: Personal history of other diseases of the respiratory system: Z87.09

## 2021-07-03 LAB — CBC
HCT: 38.7 % (ref 36.0–46.0)
Hemoglobin: 13.1 g/dL (ref 12.0–15.0)
MCH: 32.9 pg (ref 26.0–34.0)
MCHC: 33.9 g/dL (ref 30.0–36.0)
MCV: 97.2 fL (ref 80.0–100.0)
Platelets: 255 10*3/uL (ref 150–400)
RBC: 3.98 MIL/uL (ref 3.87–5.11)
RDW: 11.7 % (ref 11.5–15.5)
WBC: 4.3 10*3/uL (ref 4.0–10.5)
nRBC: 0 % (ref 0.0–0.2)

## 2021-07-03 LAB — POCT PREGNANCY, URINE: Preg Test, Ur: NEGATIVE

## 2021-07-03 LAB — TYPE AND SCREEN
ABO/RH(D): O POS
Antibody Screen: NEGATIVE

## 2021-07-03 LAB — ABO/RH: ABO/RH(D): O POS

## 2021-07-03 SURGERY — LIGATION, FALLOPIAN TUBE, LAPAROSCOPIC
Anesthesia: General | Site: Pelvis | Laterality: Bilateral

## 2021-07-03 MED ORDER — OXYCODONE HCL 5 MG PO TABS: 5.0000 mg | ORAL_TABLET | Freq: Four times a day (QID) | ORAL | 0 refills | Status: AC | PRN

## 2021-07-03 MED ORDER — MIDAZOLAM HCL 2 MG/2ML IJ SOLN
INTRAMUSCULAR | Status: DC | PRN
  Administered 2021-07-03: 2 mg via INTRAVENOUS

## 2021-07-03 MED ORDER — OXYCODONE HCL 5 MG/5ML PO SOLN: 5.0000 mg | Freq: Once | ORAL | Status: AC | PRN

## 2021-07-03 MED ORDER — ROCURONIUM BROMIDE 10 MG/ML (PF) SYRINGE
PREFILLED_SYRINGE | INTRAVENOUS | Status: DC | PRN
  Administered 2021-07-03: 50 mg via INTRAVENOUS

## 2021-07-03 MED ORDER — LACTATED RINGERS IV SOLN: INTRAVENOUS | Status: DC

## 2021-07-03 MED ORDER — FENTANYL CITRATE (PF) 100 MCG/2ML IJ SOLN
INTRAMUSCULAR | Status: DC | PRN
  Administered 2021-07-03: 50 ug via INTRAVENOUS

## 2021-07-03 MED ORDER — ACETAMINOPHEN 500 MG PO TABS
1000.0000 mg | ORAL_TABLET | ORAL | Status: AC
  Administered 2021-07-03: 1000 mg via ORAL

## 2021-07-03 MED ORDER — BUPIVACAINE HCL 0.25 % IJ SOLN
INTRAMUSCULAR | Status: DC | PRN
Start: 2021-07-03 — End: 2021-07-03
  Administered 2021-07-03: 15 mL

## 2021-07-03 MED ORDER — ONDANSETRON HCL 4 MG/2ML IJ SOLN: 4.0000 mg | Freq: Once | INTRAMUSCULAR | Status: DC | PRN

## 2021-07-03 MED ORDER — KETOROLAC TROMETHAMINE 30 MG/ML IJ SOLN: 30.0000 mg | Freq: Once | INTRAMUSCULAR | Status: DC | PRN

## 2021-07-03 MED ORDER — LIDOCAINE 2% (20 MG/ML) 5 ML SYRINGE
INTRAMUSCULAR | Status: DC | PRN
  Administered 2021-07-03: 100 mg via INTRAVENOUS

## 2021-07-03 MED ORDER — POVIDONE-IODINE 10 % EX SWAB: 2.0000 "application " | Freq: Once | CUTANEOUS | Status: DC

## 2021-07-03 MED ORDER — IBUPROFEN 600 MG PO TABS: 600.0000 mg | ORAL_TABLET | Freq: Four times a day (QID) | ORAL | 1 refills | Status: DC | PRN

## 2021-07-03 MED ORDER — ONDANSETRON HCL 4 MG/2ML IJ SOLN
INTRAMUSCULAR | Status: AC
  Filled 2021-07-03: qty 2

## 2021-07-03 MED ORDER — KETOROLAC TROMETHAMINE 30 MG/ML IJ SOLN
INTRAMUSCULAR | Status: AC
  Filled 2021-07-03: qty 1

## 2021-07-03 MED ORDER — 0.9 % SODIUM CHLORIDE (POUR BTL) OPTIME
TOPICAL | Status: DC | PRN
  Administered 2021-07-03: 500 mL

## 2021-07-03 MED ORDER — WHITE PETROLATUM EX OINT
TOPICAL_OINTMENT | CUTANEOUS | Status: AC
  Filled 2021-07-03: qty 5

## 2021-07-03 MED ORDER — MIDAZOLAM HCL 2 MG/2ML IJ SOLN
INTRAMUSCULAR | Status: AC
  Filled 2021-07-03: qty 2

## 2021-07-03 MED ORDER — CEFAZOLIN SODIUM-DEXTROSE 2-4 GM/100ML-% IV SOLN
2.0000 g | INTRAVENOUS | Status: AC
  Administered 2021-07-03: 2 g via INTRAVENOUS

## 2021-07-03 MED ORDER — OXYCODONE HCL 5 MG PO TABS
ORAL_TABLET | ORAL | Status: AC
  Filled 2021-07-03: qty 1

## 2021-07-03 MED ORDER — ACETAMINOPHEN 500 MG PO TABS
ORAL_TABLET | ORAL | Status: AC
  Filled 2021-07-03: qty 2

## 2021-07-03 MED ORDER — ROCURONIUM BROMIDE 10 MG/ML (PF) SYRINGE
PREFILLED_SYRINGE | INTRAVENOUS | Status: AC
  Filled 2021-07-03: qty 10

## 2021-07-03 MED ORDER — ONDANSETRON HCL 4 MG/2ML IJ SOLN
INTRAMUSCULAR | Status: DC | PRN
  Administered 2021-07-03: 4 mg via INTRAVENOUS

## 2021-07-03 MED ORDER — FENTANYL CITRATE (PF) 100 MCG/2ML IJ SOLN
INTRAMUSCULAR | Status: AC
  Filled 2021-07-03: qty 2

## 2021-07-03 MED ORDER — CEFAZOLIN SODIUM-DEXTROSE 2-4 GM/100ML-% IV SOLN
INTRAVENOUS | Status: AC
  Filled 2021-07-03: qty 100

## 2021-07-03 MED ORDER — SOD CITRATE-CITRIC ACID 500-334 MG/5ML PO SOLN: 30.0000 mL | ORAL | Status: DC

## 2021-07-03 MED ORDER — KETOROLAC TROMETHAMINE 30 MG/ML IJ SOLN
INTRAMUSCULAR | Status: DC | PRN
  Administered 2021-07-03: 30 mg via INTRAVENOUS

## 2021-07-03 MED ORDER — DEXAMETHASONE SODIUM PHOSPHATE 10 MG/ML IJ SOLN
INTRAMUSCULAR | Status: AC
  Filled 2021-07-03: qty 1

## 2021-07-03 MED ORDER — SUGAMMADEX SODIUM 200 MG/2ML IV SOLN
INTRAVENOUS | Status: DC | PRN
Start: 2021-07-03 — End: 2021-07-04
  Administered 2021-07-03: 150 mg via INTRAVENOUS

## 2021-07-03 MED ORDER — LIDOCAINE HCL (PF) 2 % IJ SOLN
INTRAMUSCULAR | Status: AC
  Filled 2021-07-03: qty 5

## 2021-07-03 MED ORDER — DEXAMETHASONE SODIUM PHOSPHATE 10 MG/ML IJ SOLN
INTRAMUSCULAR | Status: DC | PRN
  Administered 2021-07-03 (×2): 5 mg via INTRAVENOUS

## 2021-07-03 MED ORDER — PROPOFOL 10 MG/ML IV BOLUS
INTRAVENOUS | Status: DC | PRN
  Administered 2021-07-03: 150 mg via INTRAVENOUS

## 2021-07-03 MED ORDER — FENTANYL CITRATE (PF) 100 MCG/2ML IJ SOLN: 25.0000 ug | INTRAMUSCULAR | Status: DC | PRN

## 2021-07-03 MED ORDER — OXYCODONE HCL 5 MG PO TABS
5.0000 mg | ORAL_TABLET | Freq: Once | ORAL | Status: AC | PRN
  Administered 2021-07-03: 5 mg via ORAL

## 2021-07-03 SURGICAL SUPPLY — 33 items
ADH SKN CLS APL DERMABOND .7 (GAUZE/BANDAGES/DRESSINGS) ×1
APL SRG 38 LTWT LNG FL B (MISCELLANEOUS)
APPLICATOR ARISTA FLEXITIP XL (MISCELLANEOUS) IMPLANT
BAG RETRIEVAL 10 (BASKET)
DERMABOND ADVANCED (GAUZE/BANDAGES/DRESSINGS) ×1
DERMABOND ADVANCED .7 DNX12 (GAUZE/BANDAGES/DRESSINGS) ×1 IMPLANT
DRSG OPSITE POSTOP 3X4 (GAUZE/BANDAGES/DRESSINGS) ×2 IMPLANT
DURAPREP 26ML APPLICATOR (WOUND CARE) ×2 IMPLANT
GAUZE 4X4 16PLY ~~LOC~~+RFID DBL (SPONGE) ×2 IMPLANT
GLOVE BIOGEL PI IND STRL 8 (GLOVE) ×1 IMPLANT
GLOVE BIOGEL PI INDICATOR 8 (GLOVE) ×1
GLOVE SURG ORTHO 8.0 STRL STRW (GLOVE) ×2 IMPLANT
GOWN STRL REUS W/TWL LRG LVL3 (GOWN DISPOSABLE) ×2 IMPLANT
GOWN STRL REUS W/TWL XL LVL3 (GOWN DISPOSABLE) ×2 IMPLANT
HEMOSTAT ARISTA ABSORB 3G PWDR (HEMOSTASIS) IMPLANT
KIT TURNOVER CYSTO (KITS) ×2 IMPLANT
LIGASURE VESSEL 5MM BLUNT TIP (ELECTROSURGICAL) ×2 IMPLANT
NS IRRIG 1000ML POUR BTL (IV SOLUTION) ×2 IMPLANT
PACK LAPAROSCOPY BASIN (CUSTOM PROCEDURE TRAY) ×2 IMPLANT
PACK TRENDGUARD 450 HYBRID PRO (MISCELLANEOUS) ×1 IMPLANT
SET IRRIG TUBING LAPAROSCOPIC (IRRIGATION / IRRIGATOR) IMPLANT
SET TUBE SMOKE EVAC HIGH FLOW (TUBING) ×2 IMPLANT
SLEEVE XCEL OPT CAN 5 100 (ENDOMECHANICALS) ×2 IMPLANT
SUT MNCRL AB 4-0 PS2 18 (SUTURE) ×3 IMPLANT
SUT VICRYL 0 UR6 27IN ABS (SUTURE) ×2 IMPLANT
SYS BAG RETRIEVAL 10MM (BASKET)
SYSTEM BAG RETRIEVAL 10MM (BASKET) IMPLANT
TOWEL OR 17X26 10 PK STRL BLUE (TOWEL DISPOSABLE) ×4 IMPLANT
TRAY FOLEY W/BAG SLVR 14FR (SET/KITS/TRAYS/PACK) ×2 IMPLANT
TRENDGUARD 450 HYBRID PRO PACK (MISCELLANEOUS) ×2
TROCAR BALLN 12MMX100 BLUNT (TROCAR) ×2 IMPLANT
TROCAR XCEL NON-BLD 5MMX100MML (ENDOMECHANICALS) ×2 IMPLANT
WARMER LAPAROSCOPE (MISCELLANEOUS) ×2 IMPLANT

## 2021-07-03 NOTE — Anesthesia Postprocedure Evaluation (Signed)
Anesthesia Post Note ? ?Patient: Heather Pruitt ? ?Procedure(s) Performed: LAPAROSCOPIC TUBAL LIGATION (Bilateral: Pelvis) ? ?  ? ?Patient location during evaluation: PACU ?Anesthesia Type: General ?Level of consciousness: awake and alert ?Pain management: pain level controlled ?Vital Signs Assessment: post-procedure vital signs reviewed and stable ?Respiratory status: spontaneous breathing, nonlabored ventilation, respiratory function stable and patient connected to nasal cannula oxygen ?Cardiovascular status: blood pressure returned to baseline and stable ?Postop Assessment: no apparent nausea or vomiting ?Anesthetic complications: no ? ? ?No notable events documented. ? ?Last Vitals:  ?Vitals:  ? 07/03/21 1315 07/03/21 1330  ?BP: 107/66 106/61  ?Pulse: 85 80  ?Resp: 20 15  ?Temp:    ?SpO2: 99% 98%  ?  ?Last Pain:  ?Vitals:  ? 07/03/21 1330  ?TempSrc:   ?PainSc: 3   ? ? ?  ?  ?  ?  ?  ?  ? ?Kennth Vanbenschoten S ? ? ? ? ?

## 2021-07-03 NOTE — Discharge Instructions (Signed)

## 2021-07-03 NOTE — Op Note (Signed)
Heather Pruitt ?PROCEDURE DATE: 07/03/2021 ? ? ?PREOPERATIVE DIAGNOSIS:  Undesired fertility ? ?POSTOPERATIVE DIAGNOSIS:  Undesired fertility ? ?PROCEDURE:  Laparoscopic Bilateral Salpingectomy  ? ?SURGEON:  Dr. Lynnda Shields ? ?ASSISTANT:  n/a ? ?ANESTHESIA:  General endotracheal ? ?COMPLICATIONS:  None immediate. ? ?ESTIMATED BLOOD LOSS:  5 ml. ? ?FLUIDS: 800 ml LR. ? ?URINE OUTPUT:  200 ml of clear urine. ? ?INDICATIONS: 36 y.o. G0P0000 with undesired fertility, desires permanent sterilization. Other reversible forms of contraception were discussed with patient; she declines all other modalities.  Risks of procedure discussed with patient including permanence of method, risk of regret, bleeding, infection, injury to surrounding organs and need for additional procedures including laparotomy.  Failure risk less than 0.5% with increased risk of ectopic gestation if pregnancy occurs was also discussed with patient.  Written informed consent was obtained. ?   ?FINDINGS:  Normal uterus, fallopian tubes, and ovaries. ? ?TECHNIQUE:  The patient was taken to the operating room where general anesthesia was obtained without difficulty.  She was then placed in the dorsal lithotomy position and prepared and draped in sterile fashion.  After an adequate timeout was performed, a bivalved speculum was then placed in the patient's vagina, and the anterior lip of cervix grasped with the single-tooth tenaculum.  The [place] uterine manipulator was then advanced into the uterus and secured.  The speculum was removed from the vagina as was the tenaculum. ? ?Attention was then turned to the patient's abdomen where a 6-mm skin incision was made in the umbilical fold.  The Optiview 5-mm trocar and sleeve were then advanced without difficulty with the laparoscope under direct visualization into the abdomen.  The abdomen was then insufflated with carbon dioxide gas.  Adequate pneumoperitoneum was obtained.  A survey of the patient's pelvis and  abdomen revealed the findings above. Bilateral 5-mm lower quadrant ports were then placed under direct visualization.  The fallopian tubes were transected from the uterine attachments and the underlying mesosalpinx with the LigaSure device allowing for bilateral salpingectomy.  The fallopian tubes were then removed from the abdomen under direct visualization.  The operative site was surveyed, and it was found to be hemostatic.   No intraoperative injury to other surrounding organs was noted.  The abdomen was desufflated and all instruments were then removed from the patient's abdomen.  All skin incisions were closed with 4-0 Vicryl and Dermabond.  The uterine manipulator was removed from the cervix without complications. The patient tolerated the procedure well.  Sponge, lap, and needle counts were correct times two.  The patient was then taken to the recovery room awake, extubated and in stable condition. ? ?The patient will be discharged to home as per PACU criteria.  Routine postoperative instructions given.  She was prescribed oxycodone and Ibuprofen .  She will follow up in the clinic in 2 weeks for postoperative evaluation. ? ? ?Lynnda Shields, MD, FACOG ?Attending Obstetrician & Gynecologist ?Faculty Practice, [place]  ?

## 2021-07-03 NOTE — Interval H&P Note (Signed)
History and Physical Interval Note: ? ?07/03/2021 ?11:54 AM ? ?Heather Pruitt  has presented today for surgery, with the diagnosis of Undesired Fertility.  The various methods of treatment have been discussed with the patient and family. After consideration of risks, benefits and other options for treatment, the patient has consented to  Procedure(s): ?LAPAROSCOPIC TUBAL LIGATION (Bilateral) as a surgical intervention. Again, the patient is definitive that she wants her tubes removed.  We have discussed other alternatives in detail including pills, patch, IUD, shots and implants.  Risk of regret and surgical risks discussed.  Pt would like to move forward with the procedure. The patient's history has been reviewed, patient examined, no change in status, stable for surgery.  I have reviewed the patient's chart and labs.  Questions were answered to the patient's satisfaction.   ? ? ?Warden Fillers, MD ?Faculty attending, Center for [facility] ? ? ?

## 2021-07-03 NOTE — Anesthesia Preprocedure Evaluation (Signed)
Anesthesia Evaluation  ?Patient identified by MRN, date of birth, ID band ?Patient awake ? ? ? ?Reviewed: ?Allergy & Precautions, NPO status , Patient's Chart, lab work & pertinent test results ? ?Airway ?Mallampati: II ? ?TM Distance: >3 FB ?Neck ROM: Full ? ? ? Dental ?no notable dental hx. ? ?  ?Pulmonary ?neg pulmonary ROS, former smoker,  ?  ?Pulmonary exam normal ?breath sounds clear to auscultation ? ? ? ? ? ? Cardiovascular ?negative cardio ROS ?Normal cardiovascular exam ?Rhythm:Regular Rate:Normal ? ? ?  ?Neuro/Psych ?Anxiety negative neurological ROS ?   ? GI/Hepatic ?negative GI ROS, Neg liver ROS,   ?Endo/Other  ?negative endocrine ROS ? Renal/GU ?negative Renal ROS  ?negative genitourinary ?  ?Musculoskeletal ?negative musculoskeletal ROS ?(+)  ? Abdominal ?  ?Peds ?negative pediatric ROS ?(+)  Hematology ?negative hematology ROS ?(+)   ?Anesthesia Other Findings ? ? Reproductive/Obstetrics ?negative OB ROS ? ?  ? ? ? ? ? ? ? ? ? ? ? ? ? ?  ?  ? ? ? ? ? ? ? ? ?Anesthesia Physical ?Anesthesia Plan ? ?ASA: 2 ? ?Anesthesia Plan: General  ? ?Post-op Pain Management: Minimal or no pain anticipated  ? ?Induction: Intravenous ? ?PONV Risk Score and Plan: 3 and Ondansetron, Dexamethasone, Midazolam and Treatment may vary due to age or medical condition ? ?Airway Management Planned: Oral ETT ? ?Additional Equipment:  ? ?Intra-op Plan:  ? ?Post-operative Plan: Extubation in OR ? ?Informed Consent: I have reviewed the patients History and Physical, chart, labs and discussed the procedure including the risks, benefits and alternatives for the proposed anesthesia with the patient or authorized representative who has indicated his/her understanding and acceptance.  ? ? ? ?Dental advisory given ? ?Plan Discussed with: CRNA and Surgeon ? ?Anesthesia Plan Comments:   ? ? ? ? ? ? ?Anesthesia Quick Evaluation ? ?

## 2021-07-03 NOTE — Anesthesia Procedure Notes (Signed)
Procedure Name: Intubation ?Date/Time: 07/03/2021 12:14 PM ?Performed by: Suan Halter, CRNA ?Pre-anesthesia Checklist: Patient identified, Emergency Drugs available, Suction available and Patient being monitored ?Patient Re-evaluated:Patient Re-evaluated prior to induction ?Oxygen Delivery Method: Circle system utilized ?Preoxygenation: Pre-oxygenation with 100% oxygen ?Induction Type: IV induction ?Ventilation: Mask ventilation without difficulty ?Laryngoscope Size: Mac and 3 ?Grade View: Grade I ?Tube type: Oral ?Tube size: 7.0 mm ?Number of attempts: 1 ?Airway Equipment and Method: Stylet and Oral airway ?Placement Confirmation: ETT inserted through vocal cords under direct vision, positive ETCO2 and breath sounds checked- equal and bilateral ?Secured at: 21 cm ?Tube secured with: Tape ?Dental Injury: Teeth and Oropharynx as per pre-operative assessment  ? ? ? ? ?

## 2021-07-04 LAB — SURGICAL PATHOLOGY

## 2021-07-04 NOTE — Transfer of Care (Signed)
Immediate Anesthesia Transfer of Care Note ? ?Patient: Heather Pruitt ? ?Procedure(s) Performed: Procedure(s) (LRB): ?LAPAROSCOPIC TUBAL LIGATION (Bilateral) ? ?Patient Location: PACU ? ?Anesthesia Type: General ? ?Level of Consciousness: awake, oriented, sedated and patient cooperative ? ?Airway & Oxygen Therapy: Patient Spontanous Breathing and Patient connected to face mask oxygen ? ?Post-op Assessment: Report given to PACU RN and Post -op Vital signs reviewed and stable ? ?Post vital signs: Reviewed and stable ? ?Complications: No apparent anesthesia complications ?Last Vitals:  ?Vitals Value Taken Time  ?BP 96/56 07/03/21 1410  ?Temp 36.6 ?C 07/03/21 1410  ?Pulse 81 07/03/21 1410  ?Resp 16 07/03/21 1410  ?SpO2 100 % 07/03/21 1410  ? ? ?Last Pain:  ?Vitals:  ? 07/03/21 1410  ?TempSrc:   ?PainSc: 4   ?   ? ?Patients Stated Pain Goal: 1 (07/03/21 1410) ? ?Complications: No notable events documented. ?

## 2021-07-05 ENCOUNTER — Encounter (HOSPITAL_BASED_OUTPATIENT_CLINIC_OR_DEPARTMENT_OTHER): Payer: Self-pay | Admitting: Obstetrics and Gynecology

## 2021-07-18 DIAGNOSIS — F4325 Adjustment disorder with mixed disturbance of emotions and conduct: Secondary | ICD-10-CM | POA: Diagnosis not present

## 2021-07-18 DIAGNOSIS — Z630 Problems in relationship with spouse or partner: Secondary | ICD-10-CM | POA: Diagnosis not present

## 2021-07-20 ENCOUNTER — Encounter: Payer: Self-pay | Admitting: Obstetrics and Gynecology

## 2021-07-20 ENCOUNTER — Ambulatory Visit (INDEPENDENT_AMBULATORY_CARE_PROVIDER_SITE_OTHER): Payer: BC Managed Care – PPO | Admitting: Obstetrics and Gynecology

## 2021-07-20 DIAGNOSIS — Z4889 Encounter for other specified surgical aftercare: Secondary | ICD-10-CM | POA: Insufficient documentation

## 2021-07-20 NOTE — Progress Notes (Signed)
? ?  CC: postoperative visit ?Subjective:  ?  ?Heather Pruitt is a 36 y.o. female who presents to the clinic status post laparoscopic bilateral salpingectomy on 07/03/21. The patient is not having any pain.  Eating a regular diet without difficulty. Bowel movements are normal. No other significant postoperative concerns. ? ?The following portions of the patient's history were reviewed and updated as appropriate: allergies, current medications, past family history, past medical history, past social history, past surgical history, and problem list..  Last pap smear was normal on 03/22/21. ? ?Review of Systems ?Pertinent items are noted in HPI. ?  ?Objective:  ? ?BP 112/74   Pulse 82   Ht 5\' 5"  (1.651 m)   Wt 70.1 kg   BMI 25.71 kg/m?  ?Constitutional:  Well-developed, well-nourished female in no acute distress.   ?Skin: Skin is warm and dry, no rash noted, not diaphoretic,no erythema, no pallor.  ?Cardiovascular: Normal heart rate noted  ?Respiratory: Effort and breath sounds normal, no problems with respiration noted  ?Abdomen: Soft, bowel sounds active, non-tender, no abnormal masses  ?Incision: Healing well, no drainage, no erythema, no hernia, no seroma, no swelling, no dehiscence, incision well approximated  ?Pelvic:   Deferred  ? ?Surgical pathology () ?Normal fallopian tubes noted ?Assessment:  ? ?Doing well postoperatively.  Operative findings again reviewed. Pathology report discussed. ?  ?Plan:  ? ?1. Continue any current medications. ?2. Wound care discussed. ?3. Activity restrictions: none ?4. Anticipated return to work: now. ?5. Follow up as needed ?6.  Routine preventative health maintenance measures emphasized. ?Please refer to After Visit Summary for other counseling recommendations.  ?F/u 03/2022 for routine annual exam ? ?Lynnda Shields, MD, FACOG ?Attending [place] for [employer], [place]  ?

## 2021-07-30 ENCOUNTER — Encounter: Payer: Self-pay | Admitting: Physician Assistant

## 2021-07-31 ENCOUNTER — Other Ambulatory Visit: Payer: Self-pay | Admitting: Physician Assistant

## 2021-07-31 MED ORDER — TRAZODONE HCL 50 MG PO TABS: 25.0000 mg | ORAL_TABLET | Freq: Every evening | ORAL | 0 refills | Status: DC | PRN

## 2021-08-09 ENCOUNTER — Encounter: Payer: Self-pay | Admitting: Physician Assistant

## 2021-08-09 ENCOUNTER — Telehealth (INDEPENDENT_AMBULATORY_CARE_PROVIDER_SITE_OTHER): Payer: BC Managed Care – PPO | Admitting: Physician Assistant

## 2021-08-09 VITALS — BP 112/60 | Ht 65.0 in | Wt 155.0 lb

## 2021-08-09 DIAGNOSIS — F410 Panic disorder [episodic paroxysmal anxiety] without agoraphobia: Secondary | ICD-10-CM | POA: Diagnosis not present

## 2021-08-09 DIAGNOSIS — K130 Diseases of lips: Secondary | ICD-10-CM | POA: Insufficient documentation

## 2021-08-09 DIAGNOSIS — F5101 Primary insomnia: Secondary | ICD-10-CM | POA: Insufficient documentation

## 2021-08-09 DIAGNOSIS — Z8619 Personal history of other infectious and parasitic diseases: Secondary | ICD-10-CM | POA: Insufficient documentation

## 2021-08-09 DIAGNOSIS — F411 Generalized anxiety disorder: Secondary | ICD-10-CM | POA: Diagnosis not present

## 2021-08-09 MED ORDER — TRAZODONE HCL 50 MG PO TABS
25.0000 mg | ORAL_TABLET | Freq: Every evening | ORAL | 3 refills | Status: DC | PRN
Start: 2021-08-09 — End: 2022-08-13

## 2021-08-09 MED ORDER — VALACYCLOVIR HCL 1 G PO TABS
2000.0000 mg | ORAL_TABLET | Freq: Two times a day (BID) | ORAL | 0 refills | Status: AC
Start: 2021-08-09 — End: 2021-08-10

## 2021-08-09 MED ORDER — ALPRAZOLAM 0.25 MG PO TABS: ORAL_TABLET | ORAL | 1 refills | Status: DC

## 2021-08-09 MED ORDER — TRIAMCINOLONE ACETONIDE 0.1 % EX CREA: 1.0000 "application " | TOPICAL_CREAM | Freq: Two times a day (BID) | CUTANEOUS | 0 refills | Status: DC

## 2021-08-09 MED ORDER — VENLAFAXINE HCL ER 150 MG PO CP24: 150.0000 mg | ORAL_CAPSULE | Freq: Every day | ORAL | 3 refills | Status: DC

## 2021-08-09 NOTE — Progress Notes (Signed)
..Virtual Visit via Video Note  I connected with Heather Pruitt on 08/09/21 at  9:50 AM EDT by a video enabled telemedicine application and verified that I am speaking with the correct person using two identifiers.  Location: Patient: home Provider: clinic  .Marland KitchenParticipating in visit:  Patient: Heather Pruitt Provider: [name] PA-C Provider in training: Katrinka Blazing PA-S   I discussed the limitations of evaluation and management by telemedicine and the availability of in person appointments. The patient expressed understanding and agreed to proceed.  History of Present Illness: Pt is a 36 yo female with GAD, panic attacks, insomnia, cold sores who needs refills.   Pt is doing really well. She is in a new relationship and very happy. She is having very few panic attacks. She is using xanax as needed but not daily.   She has very few cold sores but would like something to treat them. Not sure what the cracked area in corner of lip is for the last few days. Not like her cold sores. Wonders what she can use to treat.   Trazodone is doing great for insomnia. Needs refills.    .. Active Ambulatory Problems    Diagnosis Date Noted   ALLERGIC RHINITIS 02/17/2006   ASTHMA 02/17/2006   CONSTIPATION 02/17/2006   INFECTION, URINARY TRACT NOS 02/17/2006   Contraceptive management 04/19/2013   History of Clostridium difficile infection 08/13/2016   Gastroesophageal reflux disease with esophagitis 08/14/2016   Nausea 08/14/2016   GAD (generalized anxiety disorder) 08/14/2016   Diarrhea 08/14/2016   Morphea 09/02/2018   Chronically dry eyes 09/02/2018   Low libido 09/12/2020   Medication side effect 09/12/2020   Marital conflict involving divorce 11/08/2020   Panic attacks 11/08/2020   Encounter for female sterilization procedure    Encounter for postoperative care 07/20/2021   Cheilitis 08/09/2021   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   ..    08/09/2021    9:25 AM 07/20/2021   10:45 AM 03/22/2021   10:45 AM 09/08/2020    3:12 PM 02/21/2020    8:33 AM  Depression screen PHQ 2/9  Decreased Interest 0 0 0 0 0  Down, Depressed, Hopeless 0 0 0 0 0  PHQ - 2 Score 0 0 0 0 0  Altered sleeping 1 0 1 2 1   Tired, decreased energy 1 1 0 2 1  Change in appetite 1 1 1 2  0  Feeling bad or failure about yourself  0 0 0 0 0  Trouble concentrating 1 1 0 1 0  Moving slowly or fidgety/restless 0 0 0 0 0  Suicidal thoughts 0 0 0 0 0  PHQ-9 Score 4 3 2 7 2   Difficult doing work/chores Not difficult at all  Not difficult at all Somewhat difficult    ..    08/09/2021    9:27 AM 07/20/2021   10:45 AM 03/22/2021  10:45 AM 09/08/2020    3:11 PM  GAD 7 : Generalized Anxiety Score  Nervous, Anxious, on Edge 0 0 0 1  Control/stop worrying 0 0 0 0  Worry too much - different things 0 0 1 1  Trouble relaxing 1 0 0 1  Restless 0 0 0 0  Easily annoyed or irritable 1 0 1 2  Afraid - awful might happen 0 0 0 0  Total GAD 7 Score 2 0 2 5  Anxiety Difficulty Not difficult at all  Not difficult at all Somewhat difficult     Assessment and Plan: Marland KitchenMarland KitchenSolina was seen today for follow-up.  Diagnoses and all orders for this visit:  GAD (generalized anxiety disorder) -     venlafaxine XR (EFFEXOR-XR) 150 MG 24 hr capsule; Take 1 capsule (150 mg total) by mouth daily with breakfast. -     ALPRAZolam (XANAX) 0.25 MG tablet; take 1 to 2 tablets by mouth if needed for anxiety  Panic attacks -      venlafaxine XR (EFFEXOR-XR) 150 MG 24 hr capsule; Take 1 capsule (150 mg total) by mouth daily with breakfast. -     ALPRAZolam (XANAX) 0.25 MG tablet; take 1 to 2 tablets by mouth if needed for anxiety  Cheilitis -     triamcinolone cream (KENALOG) 0.1 %; Apply 1 application. topically 2 (two) times daily.  Hx of cold sores -     valACYclovir (VALTREX) 1000 MG tablet; Take 2 tablets (2,000 mg total) by mouth 2 (two) times daily for 1 day. As needed for cold sore outbreak  Primary insomnia -     traZODone (DESYREL) 50 MG tablet; Take 0.5-1 tablets (25-50 mg total) by mouth at bedtime as needed for sleep.   PHQ/GAD looks great Refilled effexor and xanax Trazodone for sleep  I think the cracked lips is more chelitis triamcinolone given for this and valtrex for the as needed cold sores. Follow up as needed.    Follow Up Instructions:    I discussed the assessment and treatment plan with the patient. The patient was provided an opportunity to ask questions and all were answered. The patient agreed with the plan and demonstrated an understanding of the instructions.   The patient was advised to call back or seek an in-person evaluation if the symptoms worsen or if the condition fails to improve as anticipated.   [name], PA-C

## 2021-08-09 NOTE — Progress Notes (Signed)
PHQ9 (4) -GAD7 (2) completed.  Needs refills  Wants to discuss medication for cold sores

## 2021-08-23 DIAGNOSIS — F4325 Adjustment disorder with mixed disturbance of emotions and conduct: Secondary | ICD-10-CM | POA: Diagnosis not present

## 2021-08-23 DIAGNOSIS — Z630 Problems in relationship with spouse or partner: Secondary | ICD-10-CM | POA: Diagnosis not present

## 2021-09-12 ENCOUNTER — Ambulatory Visit: Payer: BC Managed Care – PPO | Admitting: Nurse Practitioner

## 2021-09-12 ENCOUNTER — Encounter: Payer: Self-pay | Admitting: Nurse Practitioner

## 2021-09-12 ENCOUNTER — Other Ambulatory Visit (HOSPITAL_COMMUNITY)
Admission: RE | Admit: 2021-09-12 | Discharge: 2021-09-12 | Disposition: A | Discharge status: Home / Self Care | Payer: BC Managed Care – PPO | Source: Ambulatory Visit | Attending: Nurse Practitioner | Admitting: Nurse Practitioner

## 2021-09-12 VITALS — BP 100/72 | HR 89 | Temp 97.1°F | Ht 65.0 in | Wt 157.4 lb

## 2021-09-12 DIAGNOSIS — F5101 Primary insomnia: Secondary | ICD-10-CM

## 2021-09-12 DIAGNOSIS — M549 Dorsalgia, unspecified: Secondary | ICD-10-CM | POA: Insufficient documentation

## 2021-09-12 DIAGNOSIS — S30824A Blister (nonthermal) of vagina and vulva, initial encounter: Secondary | ICD-10-CM | POA: Diagnosis not present

## 2021-09-12 DIAGNOSIS — L940 Localized scleroderma [morphea]: Secondary | ICD-10-CM | POA: Diagnosis not present

## 2021-09-12 DIAGNOSIS — R300 Dysuria: Secondary | ICD-10-CM | POA: Diagnosis not present

## 2021-09-12 DIAGNOSIS — N898 Other specified noninflammatory disorders of vagina: Secondary | ICD-10-CM | POA: Diagnosis not present

## 2021-09-12 DIAGNOSIS — Z113 Encounter for screening for infections with a predominantly sexual mode of transmission: Secondary | ICD-10-CM | POA: Diagnosis not present

## 2021-09-12 DIAGNOSIS — F411 Generalized anxiety disorder: Secondary | ICD-10-CM

## 2021-09-12 LAB — POCT URINALYSIS DIPSTICK
Bilirubin, UA: NEGATIVE
Glucose, UA: NEGATIVE
Ketones, UA: NEGATIVE
Leukocytes, UA: NEGATIVE
Nitrite, UA: NEGATIVE
Protein, UA: NEGATIVE
Spec Grav, UA: 1.015 (ref 1.010–1.025)
Urobilinogen, UA: 0.2 E.U./dL
pH, UA: 6.5 (ref 5.0–8.0)

## 2021-09-12 MED ORDER — VALACYCLOVIR HCL 1 G PO TABS: 1000.0000 mg | ORAL_TABLET | Freq: Two times a day (BID) | ORAL | 0 refills | Status: DC

## 2021-09-12 NOTE — Assessment & Plan Note (Signed)
She has been having generalized back pain after a workout a week and a half ago.  This is getting better, she can continue her Epsom salt baths and taking ibuprofen or Tylenol as needed for pain.  Encourage stretching before and after working out.  Follow-up if symptoms worsen.

## 2021-09-12 NOTE — Assessment & Plan Note (Signed)
Chronic, stable.  She is currently taking trazodone 25 to 50 mg daily as needed at bedtime.  We will continue this regimen.  Follow-up if symptoms worsen or with any concerns.

## 2021-09-12 NOTE — Progress Notes (Signed)
New Patient Office Visit  Subjective    Patient ID: LOVELLE Pruitt, female    DOB: [birth year]  Age: 36 y.o. MRN: [id number]  CC:  Chief Complaint  Patient presents with   Establish Care    Np. Est care. STI testing. Pt c/o blister-like sores on left side of vaginal area w/ mild pain, burning and abnormal discharge x1 wk     HPI Heather Pruitt presents for new patient visit to establish care.  Introduced to Designer, jewellery role and practice setting.  All questions answered.  Discussed provider/patient relationship and expectations.  She started having pain on her labia on Thursday. She thought it was a pimple. When she looked at it, she noted a cluster of blisters. She also started having vaginal discharge. She used OTC yeast medication which didn't help. She also states that she has been doing epsom salt baths for her back pain. She had STI testing in October and December 2022, however did not have herpes testing. She also has been having dysuria.   She has been having upper and lower back pain for the past week. It started after going to the gym. She has done epsom salt baths. She states that pain is getting better, and notes stiffness in her lower mid back in the mornings.  She has a history of anxiety and insomnia.  She states that her symptoms are well controlled at this time.  She takes venlafaxine 150 mg daily and Xanax 0.25 mg as needed for panic attack.  She also takes trazodone 50 mg at bedtime.  She denies SI/HI.     09/12/2021   11:22 AM 08/09/2021    9:25 AM 07/20/2021   10:45 AM 03/22/2021   10:45 AM 09/08/2020    3:12 PM  Depression screen PHQ 2/9  Decreased Interest 0 0 0 0 0  Down, Depressed, Hopeless 0 0 0 0 0  PHQ - 2 Score 0 0 0 0 0  Altered sleeping 1 1 0 1 2  Tired, decreased energy '1 1 1 ' 0 2  Change in appetite '1 1 1 1 2  ' Feeling bad or failure about yourself  0 0 0 0 0  Trouble concentrating '1 1 1 ' 0 1  Moving slowly or fidgety/restless 0 0 0 0 0  Suicidal  thoughts 0 0 0 0 0  PHQ-9 Score '4 4 3 2 7  ' Difficult doing work/chores Not difficult at all Not difficult at all  Not difficult at all Somewhat difficult      09/12/2021   11:23 AM 08/09/2021    9:27 AM 07/20/2021   10:45 AM 03/22/2021   10:45 AM  GAD 7 : Generalized Anxiety Score  Nervous, Anxious, on Edge 1 0 0 0  Control/stop worrying 0 0 0 0  Worry too much - different things 0 0 0 1  Trouble relaxing 0 1 0 0  Restless 0 0 0 0  Easily annoyed or irritable 1 1 0 1  Afraid - awful might happen 0 0 0 0  Total GAD 7 Score 2 2 0 2  Anxiety Difficulty Not difficult at all Not difficult at all  Not difficult at all   Outpatient Encounter Medications as of 09/12/2021  Medication Sig   ALPRAZolam (XANAX) 0.25 MG tablet take 1 to 2 tablets by mouth if needed for anxiety   Ascorbic Acid (VITAMIN C) 100 MG tablet Take 100 mg by mouth at bedtime.   Cholecalciferol (VITAMIN D) 2000 UNITS  tablet Take 2,000 Units by mouth daily.   ibuprofen (ADVIL) 600 MG tablet Take 1 tablet (600 mg total) by mouth every 6 (six) hours as needed for headache, mild pain, moderate pain or cramping.   loratadine (CLARITIN) 10 MG tablet Take 10 mg by mouth daily.   Multiple Vitamin (MULTIVITAMIN) tablet Take 1 tablet by mouth daily.   traZODone (DESYREL) 50 MG tablet Take 0.5-1 tablets (25-50 mg total) by mouth at bedtime as needed for sleep.   triamcinolone cream (KENALOG) 0.1 % Apply 1 application. topically 2 (two) times daily.   valACYclovir (VALTREX) 1000 MG tablet Take 1 tablet (1,000 mg total) by mouth 2 (two) times daily for 10 days.   venlafaxine XR (EFFEXOR-XR) 150 MG 24 hr capsule Take 1 capsule (150 mg total) by mouth daily with breakfast.   Probiotic Product (PROBIOTIC DAILY PO) Take by mouth daily. (Patient not taking: Reported on 09/12/2021)   No facility-administered encounter medications on file as of 09/12/2021.    Past Medical History:  Diagnosis Date   Allergy    Chronically dry eyes    GAD  (generalized anxiety disorder)    History of asthma    as child   History of Clostridium difficile infection 2018   happened when taking clindamyocin antibiotic   History of panic attacks    Insomnia    Localized scleroderma (morphea)    rheumotologist--- dr s. Estanislado Pandy;  localized to skin (organs not involved);   per pt involves bilateral legs, stomach, and back   Seasonal allergic rhinitis    Wears glasses     Past Surgical History:  Procedure Laterality Date   LAPAROSCOPIC TUBAL LIGATION Bilateral 07/03/2021   Procedure: LAPAROSCOPIC TUBAL LIGATION;  Surgeon: Griffin Basil, MD;  Location: [facility];  Service: Gynecology;  Laterality: Bilateral;   NASAL SINUS SURGERY  1998   WISDOM TOOTH EXTRACTION  2006   and previous multiple teeth extraction's 1996, 1998, 1999    Family History  Problem Relation Age of Onset   Hyperlipidemia Mother    Uterine cancer Mother    Anxiety disorder Father    Hypertension Father    Heart disease Father    Cancer Father        melanoma   Diabetes Brother    Cancer Maternal Uncle        Multiple myeloma and Leukemia   Diabetes Maternal Uncle    Diabetes Maternal Uncle    Diabetes Maternal Grandmother    Heart disease Maternal Grandmother    Diabetes Maternal Grandfather    Stroke Maternal Grandfather    Hypertension Paternal Grandmother    Heart disease Paternal Grandfather    Hypertension Paternal Grandfather     Social History   Socioeconomic History   Marital status: Legally Separated    Spouse name: Not on file   Number of children: Not on file   Years of education: Not on file   Highest education level: Not on file  Occupational History   Not on file  Tobacco Use   Smoking status: Former    Years: 1.00    Types: Cigarettes    Quit date: 2011    Years since quitting: 12.5   Smokeless tobacco: Never  Vaping Use   Vaping Use: Never used  Substance and Sexual Activity   Alcohol use: Yes    Comment:  occassionally   Drug use: No   Sexual activity: Yes    Birth control/protection: Surgical    Comment:  Bilateral salpingectomy 07/03/21  Other Topics Concern   Not on file  Social History Narrative   Not on file   Social Determinants of Health   Financial Resource Strain: Not on file  Food Insecurity: No Food Insecurity (02/21/2020)   Hunger Vital Sign    Worried About Running Out of Food in the Last Year: Never true    Ran Out of Food in the Last Year: Never true  Transportation Needs: No Transportation Needs (02/21/2020)   PRAPARE - Hydrologist (Medical): No    Lack of Transportation (Non-Medical): No  Physical Activity: Not on file  Stress: Not on file  Social Connections: Not on file  Intimate Partner Violence: Not on file    Review of Systems  Constitutional:  Positive for malaise/fatigue. Negative for fever.  HENT: Negative.    Eyes: Negative.   Respiratory: Negative.    Cardiovascular: Negative.   Gastrointestinal:  Positive for constipation. Negative for abdominal pain and diarrhea.  Genitourinary:  Positive for dysuria.  Musculoskeletal:  Positive for back pain.  Skin:  Positive for rash (blisters on labia).  Neurological:  Positive for headaches (usually with menstrual period).  Psychiatric/Behavioral: Negative.        Objective    BP 100/72   Pulse 89   Temp (!) 97.1 F (36.2 C) (Temporal)   Ht '5\' 5"'  (1.651 m)   Wt 157 lb 6.4 oz (71.4 kg)   LMP 09/08/2021 (Exact Date)   SpO2 99%   BMI 26.19 kg/m   Physical Exam Vitals and nursing note reviewed. Exam conducted with a chaperone present.  Constitutional:      General: She is not in acute distress.    Appearance: Normal appearance.  HENT:     Head: Normocephalic.  Eyes:     Conjunctiva/sclera: Conjunctivae normal.  Cardiovascular:     Rate and Rhythm: Normal rate and regular rhythm.     Pulses: Normal pulses.     Heart sounds: Normal heart sounds.  Pulmonary:      Effort: Pulmonary effort is normal.     Breath sounds: Normal breath sounds.  Abdominal:     Palpations: Abdomen is soft.     Tenderness: There is no abdominal tenderness.  Genitourinary:    Exam position: Lithotomy position.     Labia:        Left: Lesion (several small blisters) present.   Musculoskeletal:     Cervical back: Normal range of motion.  Skin:    General: Skin is warm.  Neurological:     General: No focal deficit present.     Mental Status: She is alert and oriented to person, place, and time.  Psychiatric:        Mood and Affect: Mood normal.        Behavior: Behavior normal.        Thought Content: Thought content normal.        Judgment: Judgment normal.       Assessment & Plan:   Problem List Items Addressed This Visit       Musculoskeletal and Integument   Morphea    Chronic, stable.  She follows with dermatology for this.  She was offered methotrexate, however declined and states that her symptoms are not that bad.  Follow-up with any concerns.        Other   GAD (generalized anxiety disorder)    Chronic, stable.  We will continue venlafaxine 150 mg daily and Xanax  0.25 mg as needed for panic attacks.  Her PHQ-9 is a 4 and her GAD-7 is a 2 today.  Overall her symptoms are stable and well controlled.  She does not need any refills at this time.  Follow-up if symptoms worsen or with any concerns.      Primary insomnia    Chronic, stable.  She is currently taking trazodone 25 to 50 mg daily as needed at bedtime.  We will continue this regimen.  Follow-up if symptoms worsen or with any concerns.      Acute midline back pain    She has been having generalized back pain after a workout a week and a half ago.  This is getting better, she can continue her Epsom salt baths and taking ibuprofen or Tylenol as needed for pain.  Encourage stretching before and after working out.  Follow-up if symptoms worsen.      Other Visit Diagnoses     Blister  (nonthermal) of vagina and vulva, initial encounter    -  Primary   Several small fluid fills blisters noted to left labia. Most consistent with HSV 2. Treat with valtrex 1g BID x10 days. Check for RPR and HSV   Relevant Orders   HIV Antibody (routine testing w rflx)   RPR   HSV(herpes simplex vrs) 1+2 ab-IgG   Vaginal discharge       Vaginal discharge noted for last week. Will check for BV, yeast, trich, gonorrhea, and chlamydia   Relevant Orders   Cervicovaginal ancillary only   HIV Antibody (routine testing w rflx)   RPR   Dysuria       Burning most likely related to blisters. U/A negative in office.    Relevant Orders   Cervicovaginal ancillary only   POCT urinalysis dipstick (Completed)       Return in about 1 year (around 09/13/2022) for CPE.   Charyl Dancer, NP

## 2021-09-12 NOTE — Assessment & Plan Note (Signed)
Chronic, stable.  She follows with dermatology for this.  She was offered methotrexate, however declined and states that her symptoms are not that bad.  Follow-up with any concerns.

## 2021-09-12 NOTE — Patient Instructions (Signed)
It was great to see you!  Start valtrex 1g (1 pill) twice a day for 10 days. Do not have intercourse when having an outbreak.   We are checking your labs today and will let you know the results via mychart/phone.   Let's follow-up in 1 year, sooner if you have concerns.  If a referral was placed today, you will be contacted for an appointment. Please note that routine referrals can sometimes take up to 3-4 weeks to process. Please call our office if you haven't heard anything after this time frame.  Take care,  Rodman Pickle, NP

## 2021-09-12 NOTE — Assessment & Plan Note (Signed)
Chronic, stable.  We will continue venlafaxine 150 mg daily and Xanax 0.25 mg as needed for panic attacks.  Her PHQ-9 is a 4 and her GAD-7 is a 2 today.  Overall her symptoms are stable and well controlled.  She does not need any refills at this time.  Follow-up if symptoms worsen or with any concerns.

## 2021-09-13 LAB — CERVICOVAGINAL ANCILLARY ONLY
Bacterial Vaginitis (gardnerella): NEGATIVE
Candida Glabrata: NEGATIVE
Candida Vaginitis: NEGATIVE
Chlamydia: NEGATIVE
Comment: NEGATIVE
Comment: NEGATIVE
Comment: NEGATIVE
Comment: NEGATIVE
Comment: NEGATIVE
Comment: NORMAL
Neisseria Gonorrhea: NEGATIVE
Trichomonas: NEGATIVE

## 2021-09-13 LAB — HSV(HERPES SIMPLEX VRS) I + II AB-IGG
HAV 1 IGG,TYPE SPECIFIC AB: <0.9 index
HSV 2 IGG,TYPE SPECIFIC AB: 2.16 index — ABNORMAL HIGH

## 2021-09-13 LAB — HIV ANTIBODY (ROUTINE TESTING W REFLEX): HIV 1&2 Ab, 4th Generation: NONREACTIVE

## 2021-09-13 LAB — RPR: RPR Ser Ql: NONREACTIVE

## 2021-09-19 ENCOUNTER — Encounter: Payer: Self-pay | Admitting: Nurse Practitioner

## 2021-09-20 DIAGNOSIS — Z630 Problems in relationship with spouse or partner: Secondary | ICD-10-CM | POA: Diagnosis not present

## 2021-09-20 DIAGNOSIS — F4325 Adjustment disorder with mixed disturbance of emotions and conduct: Secondary | ICD-10-CM | POA: Diagnosis not present

## 2021-10-30 DIAGNOSIS — F4325 Adjustment disorder with mixed disturbance of emotions and conduct: Secondary | ICD-10-CM | POA: Diagnosis not present

## 2021-10-30 DIAGNOSIS — Z630 Problems in relationship with spouse or partner: Secondary | ICD-10-CM | POA: Diagnosis not present

## 2021-11-27 DIAGNOSIS — Z630 Problems in relationship with spouse or partner: Secondary | ICD-10-CM | POA: Diagnosis not present

## 2021-11-27 DIAGNOSIS — F4325 Adjustment disorder with mixed disturbance of emotions and conduct: Secondary | ICD-10-CM | POA: Diagnosis not present

## 2022-01-02 DIAGNOSIS — F4325 Adjustment disorder with mixed disturbance of emotions and conduct: Secondary | ICD-10-CM | POA: Diagnosis not present

## 2022-01-02 DIAGNOSIS — Z630 Problems in relationship with spouse or partner: Secondary | ICD-10-CM | POA: Diagnosis not present

## 2022-01-18 ENCOUNTER — Other Ambulatory Visit: Payer: Self-pay | Admitting: Nurse Practitioner

## 2022-01-23 DIAGNOSIS — F4325 Adjustment disorder with mixed disturbance of emotions and conduct: Secondary | ICD-10-CM | POA: Diagnosis not present

## 2022-01-23 DIAGNOSIS — Z630 Problems in relationship with spouse or partner: Secondary | ICD-10-CM | POA: Diagnosis not present

## 2022-02-01 ENCOUNTER — Other Ambulatory Visit: Payer: Self-pay | Admitting: Nurse Practitioner

## 2022-02-01 ENCOUNTER — Encounter: Payer: Self-pay | Admitting: Nurse Practitioner

## 2022-02-04 MED ORDER — VALACYCLOVIR HCL 500 MG PO TABS: 500.0000 mg | ORAL_TABLET | Freq: Every day | ORAL | 1 refills | Status: DC

## 2022-02-04 NOTE — Telephone Encounter (Signed)
..  Chart supports rx  Last ov:09/12/21  Next ov: 09/20/22

## 2022-02-26 DIAGNOSIS — Z630 Problems in relationship with spouse or partner: Secondary | ICD-10-CM | POA: Diagnosis not present

## 2022-02-26 DIAGNOSIS — F4325 Adjustment disorder with mixed disturbance of emotions and conduct: Secondary | ICD-10-CM | POA: Diagnosis not present

## 2022-03-20 DIAGNOSIS — Z630 Problems in relationship with spouse or partner: Secondary | ICD-10-CM | POA: Diagnosis not present

## 2022-03-20 DIAGNOSIS — F4325 Adjustment disorder with mixed disturbance of emotions and conduct: Secondary | ICD-10-CM | POA: Diagnosis not present

## 2022-04-22 DIAGNOSIS — F4325 Adjustment disorder with mixed disturbance of emotions and conduct: Secondary | ICD-10-CM | POA: Diagnosis not present

## 2022-04-22 DIAGNOSIS — Z630 Problems in relationship with spouse or partner: Secondary | ICD-10-CM | POA: Diagnosis not present

## 2022-05-13 ENCOUNTER — Encounter: Payer: Self-pay | Admitting: Nurse Practitioner

## 2022-05-13 MED ORDER — VALACYCLOVIR HCL 1 G PO TABS: 1000.0000 mg | ORAL_TABLET | Freq: Two times a day (BID) | ORAL | 0 refills | Status: DC

## 2022-05-29 DIAGNOSIS — Z630 Problems in relationship with spouse or partner: Secondary | ICD-10-CM | POA: Diagnosis not present

## 2022-05-29 DIAGNOSIS — F4325 Adjustment disorder with mixed disturbance of emotions and conduct: Secondary | ICD-10-CM | POA: Diagnosis not present

## 2022-06-26 DIAGNOSIS — Z630 Problems in relationship with spouse or partner: Secondary | ICD-10-CM | POA: Diagnosis not present

## 2022-06-26 DIAGNOSIS — F4325 Adjustment disorder with mixed disturbance of emotions and conduct: Secondary | ICD-10-CM | POA: Diagnosis not present

## 2022-07-23 DIAGNOSIS — Z1283 Encounter for screening for malignant neoplasm of skin: Secondary | ICD-10-CM | POA: Diagnosis not present

## 2022-07-23 DIAGNOSIS — L308 Other specified dermatitis: Secondary | ICD-10-CM | POA: Diagnosis not present

## 2022-07-23 DIAGNOSIS — D225 Melanocytic nevi of trunk: Secondary | ICD-10-CM | POA: Diagnosis not present

## 2022-08-01 DIAGNOSIS — F4325 Adjustment disorder with mixed disturbance of emotions and conduct: Secondary | ICD-10-CM | POA: Diagnosis not present

## 2022-08-01 DIAGNOSIS — Z630 Problems in relationship with spouse or partner: Secondary | ICD-10-CM | POA: Diagnosis not present

## 2022-08-12 ENCOUNTER — Ambulatory Visit: Payer: BC Managed Care – PPO | Admitting: Nurse Practitioner

## 2022-08-12 DIAGNOSIS — M9901 Segmental and somatic dysfunction of cervical region: Secondary | ICD-10-CM | POA: Diagnosis not present

## 2022-08-12 DIAGNOSIS — M5033 Other cervical disc degeneration, cervicothoracic region: Secondary | ICD-10-CM | POA: Diagnosis not present

## 2022-08-13 ENCOUNTER — Encounter: Payer: Self-pay | Admitting: Nurse Practitioner

## 2022-08-13 ENCOUNTER — Other Ambulatory Visit (HOSPITAL_COMMUNITY)
Admission: RE | Admit: 2022-08-13 | Discharge: 2022-08-13 | Disposition: A | Discharge status: Home / Self Care | Payer: BC Managed Care – PPO | Source: Ambulatory Visit | Attending: Nurse Practitioner | Admitting: Nurse Practitioner

## 2022-08-13 ENCOUNTER — Ambulatory Visit (INDEPENDENT_AMBULATORY_CARE_PROVIDER_SITE_OTHER): Payer: BC Managed Care – PPO | Admitting: Nurse Practitioner

## 2022-08-13 VITALS — BP 104/72 | HR 70 | Temp 97.0°F | Ht 65.0 in | Wt 160.0 lb

## 2022-08-13 DIAGNOSIS — E7800 Pure hypercholesterolemia, unspecified: Secondary | ICD-10-CM | POA: Diagnosis not present

## 2022-08-13 DIAGNOSIS — R399 Unspecified symptoms and signs involving the genitourinary system: Secondary | ICD-10-CM

## 2022-08-13 DIAGNOSIS — F5101 Primary insomnia: Secondary | ICD-10-CM

## 2022-08-13 DIAGNOSIS — R7301 Impaired fasting glucose: Secondary | ICD-10-CM | POA: Diagnosis not present

## 2022-08-13 DIAGNOSIS — F411 Generalized anxiety disorder: Secondary | ICD-10-CM

## 2022-08-13 DIAGNOSIS — F410 Panic disorder [episodic paroxysmal anxiety] without agoraphobia: Secondary | ICD-10-CM | POA: Diagnosis not present

## 2022-08-13 DIAGNOSIS — Z0000 Encounter for general adult medical examination without abnormal findings: Secondary | ICD-10-CM

## 2022-08-13 DIAGNOSIS — B009 Herpesviral infection, unspecified: Secondary | ICD-10-CM

## 2022-08-13 LAB — CBC WITH DIFFERENTIAL/PLATELET
Basophils Absolute: 0 10*3/uL (ref 0.0–0.1)
Basophils Relative: 1 % (ref 0.0–3.0)
Eosinophils Absolute: 0.2 10*3/uL (ref 0.0–0.7)
Eosinophils Relative: 3.9 % (ref 0.0–5.0)
HCT: 41.6 % (ref 36.0–46.0)
Hemoglobin: 13.8 g/dL (ref 12.0–15.0)
Lymphocytes Relative: 42.3 % (ref 12.0–46.0)
Lymphs Abs: 2.1 10*3/uL (ref 0.7–4.0)
MCHC: 33.1 g/dL (ref 30.0–36.0)
MCV: 98 fl (ref 78.0–100.0)
Monocytes Absolute: 0.3 10*3/uL (ref 0.1–1.0)
Monocytes Relative: 6.7 % (ref 3.0–12.0)
Neutro Abs: 2.3 10*3/uL (ref 1.4–7.7)
Neutrophils Relative %: 46.1 % (ref 43.0–77.0)
Platelets: 232 10*3/uL (ref 150.0–400.0)
RBC: 4.24 Mil/uL (ref 3.87–5.11)
RDW: 11.3 % — ABNORMAL LOW (ref 11.5–15.5)
WBC: 5.1 10*3/uL (ref 4.0–10.5)

## 2022-08-13 LAB — COMPREHENSIVE METABOLIC PANEL
ALT: 30 U/L (ref 0–35)
AST: 34 U/L (ref 0–37)
Albumin: 4 g/dL (ref 3.5–5.2)
Alkaline Phosphatase: 58 U/L (ref 39–117)
BUN: 22 mg/dL (ref 6–23)
CO2: 30 mEq/L (ref 19–32)
Calcium: 8.9 mg/dL (ref 8.4–10.5)
Chloride: 104 mEq/L (ref 96–112)
Creatinine, Ser: 0.68 mg/dL (ref 0.40–1.20)
GFR: 112.87 mL/min (ref >60.00)
Glucose, Bld: 80 mg/dL (ref 70–99)
Potassium: 4.2 mEq/L (ref 3.5–5.1)
Sodium: 138 mEq/L (ref 135–145)
Total Bilirubin: 0.7 mg/dL (ref 0.2–1.2)
Total Protein: 6.4 g/dL (ref 6.0–8.3)

## 2022-08-13 LAB — POC URINALSYSI DIPSTICK (AUTOMATED)
Bilirubin, UA: NEGATIVE
Blood, UA: NEGATIVE
Glucose, UA: NEGATIVE
Ketones, UA: NEGATIVE
Leukocytes, UA: NEGATIVE
Nitrite, UA: NEGATIVE
Protein, UA: NEGATIVE
Spec Grav, UA: 1.01 (ref 1.010–1.025)
Urobilinogen, UA: 0.2 E.U./dL
pH, UA: 7 (ref 5.0–8.0)

## 2022-08-13 LAB — LIPID PANEL
Cholesterol: 174 mg/dL (ref 0–200)
HDL: 52.2 mg/dL (ref >39.00)
LDL Cholesterol: 103 mg/dL — ABNORMAL HIGH (ref 0–99)
NonHDL: 121.5
Total CHOL/HDL Ratio: 3
Triglycerides: 91 mg/dL (ref 0.0–149.0)
VLDL: 18.2 mg/dL (ref 0.0–40.0)

## 2022-08-13 LAB — TSH: TSH: 4.14 u[IU]/mL (ref 0.35–5.50)

## 2022-08-13 LAB — HEMOGLOBIN A1C: Hgb A1c MFr Bld: 4.7 % (ref 4.6–6.5)

## 2022-08-13 MED ORDER — ALPRAZOLAM 0.25 MG PO TABS: ORAL_TABLET | ORAL | 1 refills | Status: DC

## 2022-08-13 MED ORDER — VENLAFAXINE HCL ER 150 MG PO CP24: 150.0000 mg | ORAL_CAPSULE | Freq: Every day | ORAL | 3 refills | Status: DC

## 2022-08-13 MED ORDER — TRAZODONE HCL 50 MG PO TABS: 25.0000 mg | ORAL_TABLET | Freq: Every evening | ORAL | 3 refills | Status: DC | PRN

## 2022-08-13 NOTE — Assessment & Plan Note (Signed)
LDL was elevated slightly in the past, will check lipid panel and treat based on results.

## 2022-08-13 NOTE — Assessment & Plan Note (Signed)
Health maintenance reviewed and updated. Discussed nutrition, exercise. Check CMP, CBC, TSH today. Follow-up 1 year.   

## 2022-08-13 NOTE — Assessment & Plan Note (Signed)
Chronic, stable.  She is currently taking trazodone 25 to 50 mg daily as needed at bedtime.  We will continue this regimen.  Follow-up if symptoms worsen or with any concerns. 

## 2022-08-13 NOTE — Assessment & Plan Note (Signed)
She was taking valtrex daily, but it started to give her headaches. She has stopped this and will only take now as needed. Encouraged her to reach out with any flare-ups or concerns.

## 2022-08-13 NOTE — Assessment & Plan Note (Signed)
Chronic, stable. Continue venlafaxine 150mg  daily and xanax 0.25mg  BID prn. She only takes this sparingly and a 30 day supply has lasted a year. PDMP reviewed. Refill sent to the pharmacy.

## 2022-08-13 NOTE — Progress Notes (Signed)
BP 104/72 (BP Location: Left Arm)   Pulse 70   Temp (!) 97 F (36.1 C)   Ht 5\' 5"  (1.651 m)   Wt 160 lb (72.6 kg)   LMP 08/06/2022 (Exact Date)   SpO2 98%   BMI 26.63 kg/m    Subjective:    Patient ID: Heather Pruitt, female    DOB: Jan 16, 1986, 37 y.o.   MRN: [id number]  CC: Chief Complaint  Patient presents with   Annual Exam    With fasting lab work, Rx refills    HPI: Heather Pruitt is a 37 y.o. female presenting on 08/13/2022 for comprehensive medical examination. Current medical complaints include: trouble with urination  She noted some trouble with starting urination. She was in a hot tub last week, and started having trouble with urinating. She denies vaginal itching and discharge. She did a home STD kit was negative.   She currently lives with: partner Menopausal Symptoms: no  Depression and Anxiety Screen done today and results listed below:     08/13/2022   10:04 AM 09/12/2021   11:22 AM 08/09/2021    9:25 AM 07/20/2021   10:45 AM 03/22/2021   10:45 AM  Depression screen PHQ 2/9  Decreased Interest 0 0 0 0 0  Down, Depressed, Hopeless 0 0 0 0 0  PHQ - 2 Score 0 0 0 0 0  Altered sleeping 0 1 1 0 1  Tired, decreased energy 1 1 1 1  0  Change in appetite 1 1 1 1 1   Feeling bad or failure about yourself  0 0 0 0 0  Trouble concentrating 1 1 1 1  0  Moving slowly or fidgety/restless 0 0 0 0 0  Suicidal thoughts 0 0 0 0 0  PHQ-9 Score 3 4 4 3 2   Difficult doing work/chores  Not difficult at all Not difficult at all  Not difficult at all      08/13/2022   10:05 AM 09/12/2021   11:23 AM 08/09/2021    9:27 AM 07/20/2021   10:45 AM  GAD 7 : Generalized Anxiety Score  Nervous, Anxious, on Edge 0 1 0 0  Control/stop worrying 0 0 0 0  Worry too much - different things 0 0 0 0  Trouble relaxing 0 0 1 0  Restless 0 0 0 0  Easily annoyed or irritable 1 1 1  0  Afraid - awful might happen 0 0 0 0  Total GAD 7 Score 1 2 2  0  Anxiety Difficulty Not difficult at all Not difficult  at all Not difficult at all     The patient does not have a history of falls. I did not complete a risk assessment for falls. A plan of care for falls was not documented.   Past Medical History:  Past Medical History:  Diagnosis Date   Allergy    Chronically dry eyes    GAD (generalized anxiety disorder)    History of asthma    as child   History of Clostridium difficile infection 2018   happened when taking clindamyocin antibiotic   History of panic attacks    Insomnia    Localized scleroderma (morphea)    rheumotologist--- dr s. Corliss Skains;  localized to skin (organs not involved);   per pt involves bilateral legs, stomach, and back   Seasonal allergic rhinitis    Wears glasses     Surgical History:  Past Surgical History:  Procedure Laterality Date   LAPAROSCOPIC TUBAL LIGATION  Bilateral 07/03/2021   Procedure: LAPAROSCOPIC TUBAL LIGATION;  Surgeon: Warden Fillers, MD;  Location: [facility];  Service: Gynecology;  Laterality: Bilateral;   NASAL SINUS SURGERY  1998   WISDOM TOOTH EXTRACTION  2006   and previous multiple teeth extraction's 1996, 1998, 1999    Medications:  Current Outpatient Medications on File Prior to Visit  Medication Sig   APAP-Pamabrom-Pyrilamine (PMS-FORMULA PO) daily.   Ascorbic Acid (VITAMIN C) 100 MG tablet Take 100 mg by mouth at bedtime.   augmented betamethasone dipropionate (DIPROLENE-AF) 0.05 % cream Apply topically daily.   Cholecalciferol (VITAMIN D) 2000 UNITS tablet Take 2,000 Units by mouth daily.   loratadine (CLARITIN) 10 MG tablet Take 10 mg by mouth daily.   Multiple Vitamin (MULTIVITAMIN) tablet Take 1 tablet by mouth daily.   No current facility-administered medications on file prior to visit.    Allergies:  Allergies  Allergen Reactions   Augmentin [Amoxicillin-Pot Clavulanate] Diarrhea   Clindamycin/Lincomycin Diarrhea and Other (See Comments)    Got C diff after taking   Vancomycin Other (See Comments)     Ringing in ears, dizzy Dizziness    Social History:  Social History   Socioeconomic History   Marital status: Significant Other    Spouse name: Not on file   Number of children: Not on file   Years of education: Not on file   Highest education level: Not on file  Occupational History   Not on file  Tobacco Use   Smoking status: Former    Years: 1    Types: Cigarettes    Quit date: 2011    Years since quitting: 13.4   Smokeless tobacco: Never  Vaping Use   Vaping Use: Never used  Substance and Sexual Activity   Alcohol use: Yes    Comment: occassionally   Drug use: No   Sexual activity: Yes    Birth control/protection: Surgical    Comment: Bilateral salpingectomy 07/03/21  Other Topics Concern   Not on file  Social History Narrative   Not on file   Social Determinants of Health   Financial Resource Strain: Not on file  Food Insecurity: No Food Insecurity (02/21/2020)   Hunger Vital Sign    Worried About Running Out of Food in the Last Year: Never true    Ran Out of Food in the Last Year: Never true  Transportation Needs: No Transportation Needs (02/21/2020)   PRAPARE - Administrator, Civil Service (Medical): No    Lack of Transportation (Non-Medical): No  Physical Activity: Not on file  Stress: Not on file  Social Connections: Not on file  Intimate Partner Violence: Not on file   Social History   Tobacco Use  Smoking Status Former   Years: 1   Types: Cigarettes   Quit date: 2011   Years since quitting: 13.4  Smokeless Tobacco Never   Social History   Substance and Sexual Activity  Alcohol Use Yes   Comment: occassionally    Family History:  Family History  Problem Relation Age of Onset   Hyperlipidemia Mother    Uterine cancer Mother    Anxiety disorder Father    Hypertension Father    Heart disease Father    Cancer Father        melanoma   Diabetes Brother    Cancer Maternal Uncle        Multiple myeloma and Leukemia    Diabetes Maternal Uncle    Diabetes Maternal  Uncle    Diabetes Maternal Grandmother    Heart disease Maternal Grandmother    Diabetes Maternal Grandfather    Stroke Maternal Grandfather    Hypertension Paternal Grandmother    Heart disease Paternal Grandfather    Hypertension Paternal Grandfather     Past medical history, surgical history, medications, allergies, family history and social history reviewed with patient today and changes made to appropriate areas of the chart.   Review of Systems  Constitutional:  Positive for malaise/fatigue. Negative for fever.  HENT: Negative.    Eyes: Negative.   Respiratory: Negative.    Cardiovascular: Negative.   Gastrointestinal: Negative.   Genitourinary:  Positive for frequency. Negative for dysuria.  Musculoskeletal: Negative.   Skin: Negative.   Neurological: Negative.   Psychiatric/Behavioral: Negative.     All other ROS negative except what is listed above and in the HPI.      Objective:    BP 104/72 (BP Location: Left Arm)   Pulse 70   Temp (!) 97 F (36.1 C)   Ht 5\' 5"  (1.651 m)   Wt 160 lb (72.6 kg)   LMP 08/06/2022 (Exact Date)   SpO2 98%   BMI 26.63 kg/m   Wt Readings from Last 3 Encounters:  08/13/22 160 lb (72.6 kg)  09/12/21 157 lb 6.4 oz (71.4 kg)  08/09/21 155 lb (70.3 kg)    Physical Exam Vitals and nursing note reviewed.  Constitutional:      General: She is not in acute distress.    Appearance: Normal appearance.  HENT:     Head: Normocephalic and atraumatic.     Right Ear: Tympanic membrane, ear canal and external ear normal.     Left Ear: Tympanic membrane, ear canal and external ear normal.  Eyes:     Conjunctiva/sclera: Conjunctivae normal.  Cardiovascular:     Rate and Rhythm: Normal rate and regular rhythm.     Pulses: Normal pulses.     Heart sounds: Normal heart sounds.  Pulmonary:     Effort: Pulmonary effort is normal.     Breath sounds: Normal breath sounds.  Abdominal:      Palpations: Abdomen is soft.     Tenderness: There is no abdominal tenderness.  Musculoskeletal:        General: Normal range of motion.     Cervical back: Normal range of motion and neck supple.     Right lower leg: No edema.     Left lower leg: No edema.  Lymphadenopathy:     Cervical: No cervical adenopathy.  Skin:    General: Skin is warm and dry.  Neurological:     General: No focal deficit present.     Mental Status: She is alert and oriented to person, place, and time.     Cranial Nerves: No cranial nerve deficit.     Coordination: Coordination normal.     Gait: Gait normal.  Psychiatric:        Mood and Affect: Mood normal.        Behavior: Behavior normal.        Thought Content: Thought content normal.        Judgment: Judgment normal.     Results for orders placed or performed in visit on 08/13/22  POCT Urinalysis Dipstick (Automated)  Result Value Ref Range   Color, UA     Clarity, UA     Glucose, UA Negative Negative   Bilirubin, UA Negative    Ketones, UA Negative  Spec Grav, UA 1.010 1.010 - 1.025   Blood, UA Negative    pH, UA 7.0 5.0 - 8.0   Protein, UA Negative Negative   Urobilinogen, UA 0.2 0.2 or 1.0 E.U./dL   Nitrite, UA Negative    Leukocytes, UA Negative Negative      Assessment & Plan:   Problem List Items Addressed This Visit       Other   GAD (generalized anxiety disorder)    Chronic, stable.  We will continue venlafaxine 150 mg daily and Xanax 0.25 mg as needed for panic attacks.  Follow-up in 1 year or sooner with concerns.       Relevant Medications   traZODone (DESYREL) 50 MG tablet   venlafaxine XR (EFFEXOR-XR) 150 MG 24 hr capsule   ALPRAZolam (XANAX) 0.25 MG tablet   Panic attacks    Chronic, stable. Continue venlafaxine 150mg  daily and xanax 0.25mg  BID prn. She only takes this sparingly and a 30 day supply has lasted a year. PDMP reviewed. Refill sent to the pharmacy.       Relevant Medications   traZODone (DESYREL) 50  MG tablet   venlafaxine XR (EFFEXOR-XR) 150 MG 24 hr capsule   ALPRAZolam (XANAX) 0.25 MG tablet   Primary insomnia    Chronic, stable.  She is currently taking trazodone 25 to 50 mg daily as needed at bedtime.  We will continue this regimen.  Follow-up if symptoms worsen or with any concerns.      Relevant Medications   traZODone (DESYREL) 50 MG tablet   Routine general medical examination at a health care facility - Primary    Health maintenance reviewed and updated. Discussed nutrition, exercise. Check CMP, CBC, TSH today. Follow-up 1 year.        Relevant Orders   Comprehensive metabolic panel   CBC with Differential/Platelet   TSH   Pure hypercholesterolemia    LDL was elevated slightly in the past, will check lipid panel and treat based on results.       Relevant Orders   Lipid panel   HSV-2 infection    She was taking valtrex daily, but it started to give her headaches. She has stopped this and will only take now as needed. Encouraged her to reach out with any flare-ups or concerns.       Other Visit Diagnoses     UTI symptoms       U/A negative. With symptoms and recently going in a hot tub, will check for BV and yeast   Relevant Orders   POCT Urinalysis Dipstick (Automated) (Completed)   Urine cytology ancillary only   IFG (impaired fasting glucose)       Check A1c today   Relevant Orders   Hemoglobin A1c        Follow up plan: Return in about 1 year (around 08/13/2023) for CPE.   LABORATORY TESTING:  - Pap smear: up to date  IMMUNIZATIONS:   - Tdap: Tetanus vaccination status reviewed: last tetanus booster within 10 years. - Influenza: Postponed to flu season - Pneumovax: Not applicable - Prevnar: Not applicable - HPV: Not applicable - Zostavax vaccine: Not applicable  SCREENING: -Mammogram: Not applicable  - Colonoscopy: Not applicable  - Bone Density: Not applicable   PATIENT COUNSELING:   Advised to take 1 mg of folate supplement per day if  capable of pregnancy.   Sexuality: Discussed sexually transmitted diseases, partner selection, use of condoms, avoidance of unintended pregnancy  and contraceptive alternatives.  Advised to avoid cigarette smoking.  I discussed with the patient that most people either abstain from alcohol or drink within safe limits (<=14/week and <=4 drinks/occasion for males, <=7/weeks and <= 3 drinks/occasion for females) and that the risk for alcohol disorders and other health effects rises proportionally with the number of drinks per week and how often a drinker exceeds daily limits.  Discussed cessation/primary prevention of drug use and availability of treatment for abuse.   Diet: Encouraged to adjust caloric intake to maintain  or achieve ideal body weight, to reduce intake of dietary saturated fat and total fat, to limit sodium intake by avoiding high sodium foods and not adding table salt, and to maintain adequate dietary potassium and calcium preferably from fresh fruits, vegetables, and low-fat dairy products.    stressed the importance of regular exercise  Injury prevention: Discussed safety belts, safety helmets, smoke detector, smoking near bedding or upholstery.   Dental health: Discussed importance of regular tooth brushing, flossing, and dental visits.    NEXT PREVENTATIVE PHYSICAL DUE IN 1 YEAR. Return in about 1 year (around 08/13/2023) for CPE.

## 2022-08-13 NOTE — Assessment & Plan Note (Signed)
Chronic, stable.  We will continue venlafaxine 150 mg daily and Xanax 0.25 mg as needed for panic attacks.  Follow-up in 1 year or sooner with concerns.

## 2022-08-13 NOTE — Patient Instructions (Signed)
It was great to see you!  We are checking your labs today and will let you know the results via mychart/phone.   Let's follow-up in 1 year, sooner if you have concerns.  If a referral was placed today, you will be contacted for an appointment. Please note that routine referrals can sometimes take up to 3-4 weeks to process. Please call our office if you haven't heard anything after this time frame.  Take care,  Layton Naves, NP  

## 2022-08-14 DIAGNOSIS — M5033 Other cervical disc degeneration, cervicothoracic region: Secondary | ICD-10-CM | POA: Diagnosis not present

## 2022-08-14 DIAGNOSIS — M9901 Segmental and somatic dysfunction of cervical region: Secondary | ICD-10-CM | POA: Diagnosis not present

## 2022-08-14 LAB — URINE CYTOLOGY ANCILLARY ONLY
Bacterial Vaginitis-Urine: NEGATIVE
Candida Urine: POSITIVE — AB

## 2022-08-14 MED ORDER — FLUCONAZOLE 150 MG PO TABS: 150.0000 mg | ORAL_TABLET | Freq: Once | ORAL | 0 refills | Status: AC

## 2022-08-14 NOTE — Addendum Note (Signed)
Addended by: Rodman Pickle A on: 08/14/2022 05:43 PM   Modules accepted: Orders

## 2022-08-19 DIAGNOSIS — M5033 Other cervical disc degeneration, cervicothoracic region: Secondary | ICD-10-CM | POA: Diagnosis not present

## 2022-08-19 DIAGNOSIS — M9901 Segmental and somatic dysfunction of cervical region: Secondary | ICD-10-CM | POA: Diagnosis not present

## 2022-08-21 ENCOUNTER — Ambulatory Visit (INDEPENDENT_AMBULATORY_CARE_PROVIDER_SITE_OTHER): Payer: BC Managed Care – PPO | Admitting: Adult Health

## 2022-08-21 ENCOUNTER — Encounter: Payer: Self-pay | Admitting: Adult Health

## 2022-08-21 VITALS — BP 109/67 | HR 74 | Ht 65.0 in | Wt 159.0 lb

## 2022-08-21 DIAGNOSIS — Z01419 Encounter for gynecological examination (general) (routine) without abnormal findings: Secondary | ICD-10-CM | POA: Diagnosis not present

## 2022-08-21 NOTE — Progress Notes (Signed)
Patient ID: Heather Pruitt, female   DOB: 21-Jan-1986, 37 y.o.   MRN: [id number] History of Present Illness: Heather Pruitt is a 37 year old white female, with SO, G0P0, in for breast and pelvic exam, she had physical with PCP 08/13/22 with labs.  Last pap was negative HPV, NILM 03/22/21.  PCP is Rodman Pickle NP.   Current Medications, Allergies, Past Medical History, Past Surgical History, Family History and Social History were reviewed in [employer] record.     Review of Systems: Patient denies any headaches, hearing loss, fatigue, blurred vision, shortness of breath, chest pain, abdominal pain, problems with bowel movements, urination, or intercourse. No joint pain or mood swings.  She was treated recently for yeast in urine with diflucan.    Physical Exam:BP 109/67 (BP Location: Left Arm, Patient Position: Sitting, Cuff Size: Normal)   Pulse 74   Ht 5\' 5"  (1.651 m)   Wt 159 lb (72.1 kg)   LMP 08/06/2022 (Exact Date)   BMI 26.46 kg/m   General:  Well developed, well nourished, no acute distress Skin:  Warm and dry Lungs; Clear to auscultation bilaterally Breast:  No dominant palpable mass, retraction, or nipple discharge Cardiovascular: Regular rate and rhythm Pelvic:  External genitalia is normal in appearance, no lesions.  The vagina is normal in appearance. Urethra has no lesions or masses. The cervix is smooth.  Uterus is felt to be normal size, shape, and contour.  No adnexal masses or tenderness noted.Bladder is non tender, no masses felt. Extremities/musculoskeletal:  No swelling or varicosities noted, no clubbing or cyanosis Psych:  No mood changes, alert and cooperative,seems happy AA is 2 Fall risk is low    08/21/2022    8:49 AM 08/13/2022   10:04 AM 09/12/2021   11:22 AM  Depression screen PHQ 2/9  Decreased Interest 1 0 0  Down, Depressed, Hopeless 0 0 0  PHQ - 2 Score 1 0 0  Altered sleeping 0 0 1  Tired, decreased energy 1 1 1   Change in appetite 1 1  1   Feeling bad or failure about yourself  0 0 0  Trouble concentrating 1 1 1   Moving slowly or fidgety/restless 0 0 0  Suicidal thoughts 0 0 0  PHQ-9 Score 4 3 4   Difficult doing work/chores   Not difficult at all   She is on meds    08/21/2022    8:50 AM 08/13/2022   10:05 AM 09/12/2021   11:23 AM 08/09/2021    9:27 AM  GAD 7 : Generalized Anxiety Score  Nervous, Anxious, on Edge 0 0 1 0  Control/stop worrying 0 0 0 0  Worry too much - different things 0 0 0 0  Trouble relaxing 0 0 0 1  Restless 0 0 0 0  Easily annoyed or irritable 1 1 1 1   Afraid - awful might happen 0 0 0 0  Total GAD 7 Score 1 1 2 2   Anxiety Difficulty  Not difficult at all Not difficult at all Not difficult at all      Upstream - 08/21/22 0849       Pregnancy Intention Screening   Does the patient want to become pregnant in the next year? No    Does the patient's partner want to become pregnant in the next year? No    Would the patient like to discuss contraceptive options today? No      Contraception Wrap Up   Current Method Female Sterilization  End Method Female Sterilization            Examination chaperoned by Malachy Mood LPN   Impression and Plan: 1. Normal pelvic exam Physical and labs with PCP GYN exam in 1 year Mammogram at 42 Colonoscopy at 47 Stay active  She wants to lose some weight

## 2022-08-26 DIAGNOSIS — Z630 Problems in relationship with spouse or partner: Secondary | ICD-10-CM | POA: Diagnosis not present

## 2022-08-26 DIAGNOSIS — M9901 Segmental and somatic dysfunction of cervical region: Secondary | ICD-10-CM | POA: Diagnosis not present

## 2022-08-26 DIAGNOSIS — F4325 Adjustment disorder with mixed disturbance of emotions and conduct: Secondary | ICD-10-CM | POA: Diagnosis not present

## 2022-08-26 DIAGNOSIS — M5033 Other cervical disc degeneration, cervicothoracic region: Secondary | ICD-10-CM | POA: Diagnosis not present

## 2022-08-28 DIAGNOSIS — M9901 Segmental and somatic dysfunction of cervical region: Secondary | ICD-10-CM | POA: Diagnosis not present

## 2022-08-28 DIAGNOSIS — M5033 Other cervical disc degeneration, cervicothoracic region: Secondary | ICD-10-CM | POA: Diagnosis not present

## 2022-08-29 DIAGNOSIS — M5033 Other cervical disc degeneration, cervicothoracic region: Secondary | ICD-10-CM | POA: Diagnosis not present

## 2022-08-29 DIAGNOSIS — M9901 Segmental and somatic dysfunction of cervical region: Secondary | ICD-10-CM | POA: Diagnosis not present

## 2022-09-02 DIAGNOSIS — M5033 Other cervical disc degeneration, cervicothoracic region: Secondary | ICD-10-CM | POA: Diagnosis not present

## 2022-09-02 DIAGNOSIS — M9901 Segmental and somatic dysfunction of cervical region: Secondary | ICD-10-CM | POA: Diagnosis not present

## 2022-09-04 DIAGNOSIS — M9901 Segmental and somatic dysfunction of cervical region: Secondary | ICD-10-CM | POA: Diagnosis not present

## 2022-09-04 DIAGNOSIS — M5033 Other cervical disc degeneration, cervicothoracic region: Secondary | ICD-10-CM | POA: Diagnosis not present

## 2022-09-19 DIAGNOSIS — M5033 Other cervical disc degeneration, cervicothoracic region: Secondary | ICD-10-CM | POA: Diagnosis not present

## 2022-09-19 DIAGNOSIS — M9901 Segmental and somatic dysfunction of cervical region: Secondary | ICD-10-CM | POA: Diagnosis not present

## 2022-09-20 ENCOUNTER — Encounter: Payer: BC Managed Care – PPO | Admitting: Nurse Practitioner

## 2022-09-23 DIAGNOSIS — M9901 Segmental and somatic dysfunction of cervical region: Secondary | ICD-10-CM | POA: Diagnosis not present

## 2022-09-23 DIAGNOSIS — F4325 Adjustment disorder with mixed disturbance of emotions and conduct: Secondary | ICD-10-CM | POA: Diagnosis not present

## 2022-09-23 DIAGNOSIS — Z630 Problems in relationship with spouse or partner: Secondary | ICD-10-CM | POA: Diagnosis not present

## 2022-09-23 DIAGNOSIS — M5033 Other cervical disc degeneration, cervicothoracic region: Secondary | ICD-10-CM | POA: Diagnosis not present

## 2022-09-25 DIAGNOSIS — M5033 Other cervical disc degeneration, cervicothoracic region: Secondary | ICD-10-CM | POA: Diagnosis not present

## 2022-09-25 DIAGNOSIS — M9901 Segmental and somatic dysfunction of cervical region: Secondary | ICD-10-CM | POA: Diagnosis not present

## 2022-09-30 DIAGNOSIS — M9901 Segmental and somatic dysfunction of cervical region: Secondary | ICD-10-CM | POA: Diagnosis not present

## 2022-09-30 DIAGNOSIS — M5033 Other cervical disc degeneration, cervicothoracic region: Secondary | ICD-10-CM | POA: Diagnosis not present

## 2022-10-02 DIAGNOSIS — M5033 Other cervical disc degeneration, cervicothoracic region: Secondary | ICD-10-CM | POA: Diagnosis not present

## 2022-10-02 DIAGNOSIS — M9901 Segmental and somatic dysfunction of cervical region: Secondary | ICD-10-CM | POA: Diagnosis not present

## 2022-10-07 DIAGNOSIS — M9901 Segmental and somatic dysfunction of cervical region: Secondary | ICD-10-CM | POA: Diagnosis not present

## 2022-10-07 DIAGNOSIS — M5033 Other cervical disc degeneration, cervicothoracic region: Secondary | ICD-10-CM | POA: Diagnosis not present

## 2022-10-09 DIAGNOSIS — M5033 Other cervical disc degeneration, cervicothoracic region: Secondary | ICD-10-CM | POA: Diagnosis not present

## 2022-10-09 DIAGNOSIS — M9901 Segmental and somatic dysfunction of cervical region: Secondary | ICD-10-CM | POA: Diagnosis not present

## 2022-10-14 DIAGNOSIS — M9901 Segmental and somatic dysfunction of cervical region: Secondary | ICD-10-CM | POA: Diagnosis not present

## 2022-10-14 DIAGNOSIS — M5033 Other cervical disc degeneration, cervicothoracic region: Secondary | ICD-10-CM | POA: Diagnosis not present

## 2022-10-16 DIAGNOSIS — M9901 Segmental and somatic dysfunction of cervical region: Secondary | ICD-10-CM | POA: Diagnosis not present

## 2022-10-16 DIAGNOSIS — M5033 Other cervical disc degeneration, cervicothoracic region: Secondary | ICD-10-CM | POA: Diagnosis not present

## 2022-10-17 ENCOUNTER — Other Ambulatory Visit: Payer: Self-pay | Admitting: Nurse Practitioner

## 2022-10-17 NOTE — Telephone Encounter (Signed)
Requesting: VALACYCLOVIR 500MG  TABLETS requesting QTY: 90 Last Visit: 08/13/2022 Next Visit: 08/14/2023 Last Refill: by Historical Provider  Please Advise

## 2022-10-21 DIAGNOSIS — M9901 Segmental and somatic dysfunction of cervical region: Secondary | ICD-10-CM | POA: Diagnosis not present

## 2022-10-21 DIAGNOSIS — M5033 Other cervical disc degeneration, cervicothoracic region: Secondary | ICD-10-CM | POA: Diagnosis not present

## 2022-10-23 DIAGNOSIS — M9901 Segmental and somatic dysfunction of cervical region: Secondary | ICD-10-CM | POA: Diagnosis not present

## 2022-10-23 DIAGNOSIS — M5033 Other cervical disc degeneration, cervicothoracic region: Secondary | ICD-10-CM | POA: Diagnosis not present

## 2022-10-28 DIAGNOSIS — M9901 Segmental and somatic dysfunction of cervical region: Secondary | ICD-10-CM | POA: Diagnosis not present

## 2022-10-28 DIAGNOSIS — M5033 Other cervical disc degeneration, cervicothoracic region: Secondary | ICD-10-CM | POA: Diagnosis not present

## 2022-10-30 DIAGNOSIS — M5033 Other cervical disc degeneration, cervicothoracic region: Secondary | ICD-10-CM | POA: Diagnosis not present

## 2022-10-30 DIAGNOSIS — M9901 Segmental and somatic dysfunction of cervical region: Secondary | ICD-10-CM | POA: Diagnosis not present

## 2022-10-31 DIAGNOSIS — F4325 Adjustment disorder with mixed disturbance of emotions and conduct: Secondary | ICD-10-CM | POA: Diagnosis not present

## 2022-10-31 DIAGNOSIS — Z630 Problems in relationship with spouse or partner: Secondary | ICD-10-CM | POA: Diagnosis not present

## 2022-11-04 DIAGNOSIS — M5033 Other cervical disc degeneration, cervicothoracic region: Secondary | ICD-10-CM | POA: Diagnosis not present

## 2022-11-04 DIAGNOSIS — M9901 Segmental and somatic dysfunction of cervical region: Secondary | ICD-10-CM | POA: Diagnosis not present

## 2022-11-07 DIAGNOSIS — M5033 Other cervical disc degeneration, cervicothoracic region: Secondary | ICD-10-CM | POA: Diagnosis not present

## 2022-11-07 DIAGNOSIS — M9901 Segmental and somatic dysfunction of cervical region: Secondary | ICD-10-CM | POA: Diagnosis not present

## 2022-11-13 DIAGNOSIS — M5033 Other cervical disc degeneration, cervicothoracic region: Secondary | ICD-10-CM | POA: Diagnosis not present

## 2022-11-13 DIAGNOSIS — M9901 Segmental and somatic dysfunction of cervical region: Secondary | ICD-10-CM | POA: Diagnosis not present

## 2022-12-03 DIAGNOSIS — Z630 Problems in relationship with spouse or partner: Secondary | ICD-10-CM | POA: Diagnosis not present

## 2022-12-03 DIAGNOSIS — F4325 Adjustment disorder with mixed disturbance of emotions and conduct: Secondary | ICD-10-CM | POA: Diagnosis not present

## 2023-01-06 ENCOUNTER — Other Ambulatory Visit: Payer: Self-pay | Admitting: Nurse Practitioner

## 2023-01-08 DIAGNOSIS — Z630 Problems in relationship with spouse or partner: Secondary | ICD-10-CM | POA: Diagnosis not present

## 2023-01-08 DIAGNOSIS — F4325 Adjustment disorder with mixed disturbance of emotions and conduct: Secondary | ICD-10-CM | POA: Diagnosis not present

## 2023-01-21 ENCOUNTER — Encounter: Payer: Self-pay | Admitting: Nurse Practitioner

## 2023-02-12 DIAGNOSIS — F4325 Adjustment disorder with mixed disturbance of emotions and conduct: Secondary | ICD-10-CM | POA: Diagnosis not present

## 2023-02-12 DIAGNOSIS — Z630 Problems in relationship with spouse or partner: Secondary | ICD-10-CM | POA: Diagnosis not present

## 2023-02-21 ENCOUNTER — Other Ambulatory Visit: Payer: Self-pay | Admitting: Nurse Practitioner

## 2023-03-20 DIAGNOSIS — Z630 Problems in relationship with spouse or partner: Secondary | ICD-10-CM | POA: Diagnosis not present

## 2023-03-20 DIAGNOSIS — F4325 Adjustment disorder with mixed disturbance of emotions and conduct: Secondary | ICD-10-CM | POA: Diagnosis not present

## 2023-03-27 ENCOUNTER — Other Ambulatory Visit: Payer: Self-pay | Admitting: Nurse Practitioner

## 2023-03-27 NOTE — Telephone Encounter (Signed)
Requesting: VALACYCLOVIR 1GM TABLETS  Last Visit: 08/13/2022 Next Visit: 08/14/2023 Last Refill: 02/15/2023  Please Advise

## 2023-07-14 DIAGNOSIS — Z630 Problems in relationship with spouse or partner: Secondary | ICD-10-CM | POA: Diagnosis not present

## 2023-07-14 DIAGNOSIS — F4325 Adjustment disorder with mixed disturbance of emotions and conduct: Secondary | ICD-10-CM | POA: Diagnosis not present

## 2023-07-23 DIAGNOSIS — Z1283 Encounter for screening for malignant neoplasm of skin: Secondary | ICD-10-CM | POA: Diagnosis not present

## 2023-07-23 DIAGNOSIS — D225 Melanocytic nevi of trunk: Secondary | ICD-10-CM | POA: Diagnosis not present

## 2023-08-14 ENCOUNTER — Encounter: Payer: Self-pay | Admitting: Nurse Practitioner

## 2023-08-14 ENCOUNTER — Ambulatory Visit (INDEPENDENT_AMBULATORY_CARE_PROVIDER_SITE_OTHER): Payer: BC Managed Care – PPO | Admitting: Nurse Practitioner

## 2023-08-14 VITALS — BP 100/72 | HR 73 | Temp 97.2°F | Ht 65.0 in | Wt 148.8 lb

## 2023-08-14 DIAGNOSIS — F5101 Primary insomnia: Secondary | ICD-10-CM | POA: Diagnosis not present

## 2023-08-14 DIAGNOSIS — F411 Generalized anxiety disorder: Secondary | ICD-10-CM | POA: Diagnosis not present

## 2023-08-14 DIAGNOSIS — B009 Herpesviral infection, unspecified: Secondary | ICD-10-CM

## 2023-08-14 DIAGNOSIS — R350 Frequency of micturition: Secondary | ICD-10-CM | POA: Diagnosis not present

## 2023-08-14 DIAGNOSIS — F410 Panic disorder [episodic paroxysmal anxiety] without agoraphobia: Secondary | ICD-10-CM | POA: Diagnosis not present

## 2023-08-14 DIAGNOSIS — Z0000 Encounter for general adult medical examination without abnormal findings: Secondary | ICD-10-CM | POA: Diagnosis not present

## 2023-08-14 LAB — POCT URINALYSIS DIPSTICK
Bilirubin, UA: NEGATIVE
Blood, UA: NEGATIVE
Glucose, UA: NEGATIVE
Ketones, UA: NEGATIVE
Leukocytes, UA: NEGATIVE
Nitrite, UA: NEGATIVE
Protein, UA: NEGATIVE
Spec Grav, UA: 1.015 (ref 1.010–1.025)
Urobilinogen, UA: 0.2 U/dL
pH, UA: 7 (ref 5.0–8.0)

## 2023-08-14 MED ORDER — VALACYCLOVIR HCL 500 MG PO TABS: 500.0000 mg | ORAL_TABLET | Freq: Every day | ORAL | 3 refills | Status: AC

## 2023-08-14 MED ORDER — ALPRAZOLAM 0.25 MG PO TABS
ORAL_TABLET | ORAL | 1 refills | Status: AC
Start: 2023-08-14 — End: ?

## 2023-08-14 MED ORDER — TRAZODONE HCL 50 MG PO TABS
25.0000 mg | ORAL_TABLET | Freq: Every evening | ORAL | 3 refills | Status: AC | PRN
Start: 2023-08-14 — End: ?

## 2023-08-14 MED ORDER — VENLAFAXINE HCL ER 150 MG PO CP24: 150.0000 mg | ORAL_CAPSULE | Freq: Every day | ORAL | 3 refills | Status: AC

## 2023-08-14 NOTE — Assessment & Plan Note (Signed)
 Chronic stable. Continue valtrex  500mg  daily.

## 2023-08-14 NOTE — Assessment & Plan Note (Signed)
Health maintenance reviewed and updated. Discussed nutrition, exercise. Follow-up 1 year.

## 2023-08-14 NOTE — Progress Notes (Signed)
 BP 100/72 (BP Location: Left Arm, Patient Position: Sitting, Cuff Size: Small)   Pulse 73   Temp (!) 97.2 F (36.2 C)   Ht 5\' 5"  (1.651 m)   Wt 148 lb 12.8 oz (67.5 kg)   LMP 07/24/2023 (Exact Date)   SpO2 100%   BMI 24.76 kg/m    Subjective:    Patient ID: Heather Pruitt, female    DOB: 10-Nov-1985, 38 y.o.   MRN: [id number]  CC: Chief Complaint  Patient presents with   Annual Exam    With fasting labs, concerns with soreness after exercising, request a urinalysis    HPI: Heather Pruitt is a 38 y.o. female presenting on 08/14/2023 for comprehensive medical examination. Current medical complaints include:back pain  She has been experiencing back pain, especially after exercise. She has been doing epsom salt baths, magnesium, and tart cherry tablets. The pain does not radiate. She tries to stretch when she has time. She takes advil  as needed.   She currently lives with: partner Menopausal Symptoms: no  Depression and Anxiety Screen done today and results listed below:     08/14/2023    8:42 AM 08/21/2022    8:49 AM 08/13/2022   10:04 AM 09/12/2021   11:22 AM 08/09/2021    9:25 AM  Depression screen PHQ 2/9  Decreased Interest 0 1 0 0 0  Down, Depressed, Hopeless 0 0 0 0 0  PHQ - 2 Score 0 1 0 0 0  Altered sleeping 0 0 0 1 1  Tired, decreased energy 1 1 1 1 1   Change in appetite 0 1 1 1 1   Feeling bad or failure about yourself  0 0 0 0 0  Trouble concentrating 1 1 1 1 1   Moving slowly or fidgety/restless 0 0 0 0 0  Suicidal thoughts 0 0 0 0 0  PHQ-9 Score 2 4 3 4 4   Difficult doing work/chores Not difficult at all   Not difficult at all Not difficult at all      08/21/2022    8:50 AM 08/13/2022   10:05 AM 09/12/2021   11:23 AM 08/09/2021    9:27 AM  GAD 7 : Generalized Anxiety Score  Nervous, Anxious, on Edge 0 0 1 0  Control/stop worrying 0 0 0 0  Worry too much - different things 0 0 0 0  Trouble relaxing 0 0 0 1  Restless 0 0 0 0  Easily annoyed or irritable 1 1 1 1    Afraid - awful might happen 0 0 0 0  Total GAD 7 Score 1 1 2 2   Anxiety Difficulty  Not difficult at all Not difficult at all Not difficult at all    The patient does not have a history of falls. I did not complete a risk assessment for falls. A plan of care for falls was not documented.   Past Medical History:  Past Medical History:  Diagnosis Date   Allergy    Chronically dry eyes    GAD (generalized anxiety disorder)    Herpes simplex virus (HSV) infection    History of asthma    as child   History of Clostridium difficile infection 2018   happened when taking clindamyocin antibiotic   History of panic attacks    Insomnia    Localized scleroderma (morphea)    rheumotologist--- dr s. Alvira Josephs;  localized to skin (organs not involved);   per pt involves bilateral legs, stomach, and back   Seasonal  allergic rhinitis    Wears glasses     Surgical History:  Past Surgical History:  Procedure Laterality Date   LAPAROSCOPIC TUBAL LIGATION Bilateral 07/03/2021   Procedure: LAPAROSCOPIC TUBAL LIGATION;  Surgeon: Abigail Abler, MD;  Location: [facility];  Service: Gynecology;  Laterality: Bilateral;   NASAL SINUS SURGERY  1998   WISDOM TOOTH EXTRACTION  2006   and previous multiple teeth extraction's 1996, 1998, 1999    Medications:  Current Outpatient Medications on File Prior to Visit  Medication Sig   APAP-Pamabrom-Pyrilamine (PMS-FORMULA PO) daily.   Ascorbic Acid (VITAMIN C) 100 MG tablet Take 100 mg by mouth at bedtime.   augmented betamethasone dipropionate (DIPROLENE-AF) 0.05 % cream Apply topically daily.   Cholecalciferol (VITAMIN D ) 2000 UNITS tablet Take 2,000 Units by mouth daily.   loratadine (CLARITIN) 10 MG tablet Take 10 mg by mouth daily.   Multiple Vitamin (MULTIVITAMIN) tablet Take 1 tablet by mouth daily.   valACYclovir  (VALTREX ) 1000 MG tablet TAKE 1 TABLET(1000 MG) BY MOUTH TWICE DAILY   No current facility-administered medications  on file prior to visit.    Allergies:  Allergies  Allergen Reactions   Augmentin [Amoxicillin-Pot Clavulanate] Diarrhea   Clindamycin/Lincomycin Diarrhea and Other (See Comments)    Got C diff after taking   Vancomycin Other (See Comments)    Ringing in ears, dizzy Dizziness    Social History:  Social History   Socioeconomic History   Marital status: Significant Other    Spouse name: Not on file   Number of children: Not on file   Years of education: Not on file   Highest education level: Associate degree: occupational, Scientist, product/process development, or vocational program  Occupational History   Not on file  Tobacco Use   Smoking status: Former    Current packs/day: 0.00    Types: Cigarettes    Start date: 2010    Quit date: 2011    Years since quitting: 14.4   Smokeless tobacco: Never  Vaping Use   Vaping status: Never Used  Substance and Sexual Activity   Alcohol use: Yes    Comment: occassionally   Drug use: No   Sexual activity: Yes    Birth control/protection: Surgical    Comment: Bilateral salpingectomy 07/03/21  Other Topics Concern   Not on file  Social History Narrative   Not on file   Social Drivers of Health   Financial Resource Strain: Low Risk  (08/13/2023)   Overall Financial Resource Strain (CARDIA)    Difficulty of Paying Living Expenses: Not hard at all  Food Insecurity: No Food Insecurity (08/13/2023)   Hunger Vital Sign    Worried About Running Out of Food in the Last Year: Never true    Ran Out of Food in the Last Year: Never true  Transportation Needs: No Transportation Needs (08/13/2023)   PRAPARE - Administrator, Civil Service (Medical): No    Lack of Transportation (Non-Medical): No  Physical Activity: Sufficiently Active (08/13/2023)   Exercise Vital Sign    Days of Exercise per Week: 5 days    Minutes of Exercise per Session: 50 min  Stress: Stress Concern Present (08/13/2023)   [employer] of Occupational Health - Occupational Stress  Questionnaire    Feeling of Stress : To some extent  Social Connections: Socially Isolated (08/13/2023)   Social Connection and Isolation Panel [NHANES]    Frequency of Communication with Friends and Family: Once a week    Frequency of  Social Gatherings with Friends and Family: Once a week    Attends Religious Services: Never    Database administrator or Organizations: No    Attends Engineer, structural: Never    Marital Status: Living with partner  Intimate Partner Violence: Not At Risk (08/21/2022)   Humiliation, Afraid, Rape, and Kick questionnaire    Fear of Current or Ex-Partner: No    Emotionally Abused: No    Physically Abused: No    Sexually Abused: No   Social History   Tobacco Use  Smoking Status Former   Current packs/day: 0.00   Types: Cigarettes   Start date: 2010   Quit date: 2011   Years since quitting: 14.4  Smokeless Tobacco Never   Social History   Substance and Sexual Activity  Alcohol Use Yes   Comment: occassionally    Family History:  Family History  Problem Relation Age of Onset   Heart disease Paternal Grandfather    Hypertension Paternal Grandfather    Hypertension Paternal Grandmother    Diabetes Maternal Grandmother    Heart disease Maternal Grandmother    Diabetes Maternal Grandfather    Stroke Maternal Grandfather    Anxiety disorder Father    Hypertension Father    Heart disease Father    Cancer Father        melanoma   Hyperlipidemia Mother    Uterine cancer Mother    Diabetes Brother    Cancer Maternal Uncle        Multiple myeloma and Leukemia   Diabetes Maternal Uncle    Diabetes Maternal Uncle     Past medical history, surgical history, medications, allergies, family history and social history reviewed with patient today and changes made to appropriate areas of the chart.   Review of Systems  Constitutional: Negative.   HENT: Negative.    Eyes: Negative.   Respiratory: Negative.    Cardiovascular:  Positive for  palpitations (at times). Negative for chest pain and leg swelling.  Gastrointestinal: Negative.   Genitourinary:  Positive for frequency. Negative for dysuria and urgency.  Musculoskeletal:  Positive for back pain. Negative for myalgias.  Skin: Negative.   Neurological: Negative.   Psychiatric/Behavioral: Negative.     All other ROS negative except what is listed above and in the HPI.      Objective:     BP 100/72 (BP Location: Left Arm, Patient Position: Sitting, Cuff Size: Small)   Pulse 73   Temp (!) 97.2 F (36.2 C)   Ht 5\' 5"  (1.651 m)   Wt 148 lb 12.8 oz (67.5 kg)   LMP 07/24/2023 (Exact Date)   SpO2 100%   BMI 24.76 kg/m   Wt Readings from Last 3 Encounters:  08/14/23 148 lb 12.8 oz (67.5 kg)  08/21/22 159 lb (72.1 kg)  08/13/22 160 lb (72.6 kg)    Physical Exam Vitals and nursing note reviewed.  Constitutional:      General: She is not in acute distress.    Appearance: Normal appearance.  HENT:     Head: Normocephalic and atraumatic.     Right Ear: Tympanic membrane, ear canal and external ear normal.     Left Ear: Tympanic membrane, ear canal and external ear normal.  Eyes:     Conjunctiva/sclera: Conjunctivae normal.  Cardiovascular:     Rate and Rhythm: Normal rate and regular rhythm.     Pulses: Normal pulses.     Heart sounds: Normal heart sounds.  Pulmonary:  Effort: Pulmonary effort is normal.     Breath sounds: Normal breath sounds.  Abdominal:     Palpations: Abdomen is soft.     Tenderness: There is no abdominal tenderness.  Musculoskeletal:        General: Normal range of motion.     Cervical back: Normal range of motion and neck supple.     Right lower leg: No edema.     Left lower leg: No edema.  Lymphadenopathy:     Cervical: No cervical adenopathy.  Skin:    General: Skin is warm and dry.  Neurological:     General: No focal deficit present.     Mental Status: She is alert and oriented to person, place, and time.     Cranial  Nerves: No cranial nerve deficit.     Coordination: Coordination normal.     Gait: Gait normal.  Psychiatric:        Mood and Affect: Mood normal.        Behavior: Behavior normal.        Thought Content: Thought content normal.        Judgment: Judgment normal.     Results for orders placed or performed in visit on 08/14/23  POCT urinalysis dipstick   Collection Time: 08/14/23  9:03 AM  Result Value Ref Range   Color, UA straw    Clarity, UA clear    Glucose, UA Negative Negative   Bilirubin, UA negative    Ketones, UA negative    Spec Grav, UA 1.015 1.010 - 1.025   Blood, UA negative    pH, UA 7.0 5.0 - 8.0   Protein, UA Negative Negative   Urobilinogen, UA 0.2 0.2 or 1.0 E.U./dL   Nitrite, UA negative    Leukocytes, UA Negative Negative   Appearance clear    Odor        Assessment & Plan:   Problem List Items Addressed This Visit       Other   GAD (generalized anxiety disorder)   Chronic, stable.  We will continue venlafaxine  150 mg daily and Xanax  0.25 mg as needed for panic attacks.  Follow-up in 1 year or sooner with concerns.       Relevant Medications   ALPRAZolam  (XANAX ) 0.25 MG tablet   traZODone  (DESYREL ) 50 MG tablet   venlafaxine  XR (EFFEXOR -XR) 150 MG 24 hr capsule   Panic attacks   Chronic, stable. Continue venlafaxine  150mg  daily and xanax  0.25mg  BID prn. She only takes this sparingly and a 30 day supply has lasted a year. PDMP reviewed. Refill sent to the pharmacy.       Relevant Medications   ALPRAZolam  (XANAX ) 0.25 MG tablet   traZODone  (DESYREL ) 50 MG tablet   venlafaxine  XR (EFFEXOR -XR) 150 MG 24 hr capsule   Primary insomnia   Chronic, stable.  She is currently taking trazodone  25 to 50 mg daily as needed at bedtime.  We will continue this regimen.  Follow-up if symptoms worsen or with any concerns.      Relevant Medications   traZODone  (DESYREL ) 50 MG tablet   Routine general medical examination at a health care facility - Primary    Health maintenance reviewed and updated. Discussed nutrition, exercise. Follow-up 1 year.        HSV-2 infection   Chronic stable. Continue valtrex  500mg  daily.       Relevant Medications   valACYclovir  (VALTREX ) 500 MG tablet   Other Visit Diagnoses  Urinary frequency       U/A negative. Continue drinking plenty of fluids.   Relevant Orders   POCT urinalysis dipstick (Completed)        Follow up plan: Return in about 1 year (around 08/13/2024) for CPE.   LABORATORY TESTING:  - Pap smear: up to date  IMMUNIZATIONS:   - Tdap: Tetanus vaccination status reviewed: last tetanus booster within 10 years. - Influenza: Postponed to flu season - Pneumovax: Not applicable - Prevnar: Not applicable - HPV: Not applicable - Shingrix vaccine: Not applicable  SCREENING: -Mammogram: Not applicable  - Colonoscopy: Not applicable  - Bone Density: Not applicable   PATIENT COUNSELING:   Advised to take 1 mg of folate supplement per day if capable of pregnancy.   Sexuality: Discussed sexually transmitted diseases, partner selection, use of condoms, avoidance of unintended pregnancy  and contraceptive alternatives.   Advised to avoid cigarette smoking.  I discussed with the patient that most people either abstain from alcohol or drink within safe limits (<=14/week and <=4 drinks/occasion for males, <=7/weeks and <= 3 drinks/occasion for females) and that the risk for alcohol disorders and other health effects rises proportionally with the number of drinks per week and how often a drinker exceeds daily limits.  Discussed cessation/primary prevention of drug use and availability of treatment for abuse.   Diet: Encouraged to adjust caloric intake to maintain  or achieve ideal body weight, to reduce intake of dietary saturated fat and total fat, to limit sodium intake by avoiding high sodium foods and not adding table salt, and to maintain adequate dietary potassium and calcium  preferably from fresh fruits, vegetables, and low-fat dairy products.    stressed the importance of regular exercise  Injury prevention: Discussed safety belts, safety helmets, smoke detector, smoking near bedding or upholstery.   Dental health: Discussed importance of regular tooth brushing, flossing, and dental visits.    NEXT PREVENTATIVE PHYSICAL DUE IN 1 YEAR. Return in about 1 year (around 08/13/2024) for CPE.  Tyrus Wilms A Carlyn Mullenbach

## 2023-08-14 NOTE — Assessment & Plan Note (Signed)
Chronic, stable. Continue venlafaxine 150mg  daily and xanax 0.25mg  BID prn. She only takes this sparingly and a 30 day supply has lasted a year. PDMP reviewed. Refill sent to the pharmacy.

## 2023-08-14 NOTE — Patient Instructions (Signed)
 It was great to see you!  Keep up the great work!   Start the back stretches daily   Let's follow-up in 1 year, sooner if you have concerns.  If a referral was placed today, you will be contacted for an appointment. Please note that routine referrals can sometimes take up to 3-4 weeks to process. Please call our office if you haven't heard anything after this time frame.  Take care,  Rheba Cedar, NP

## 2023-08-14 NOTE — Assessment & Plan Note (Signed)
Chronic, stable.  We will continue venlafaxine 150 mg daily and Xanax 0.25 mg as needed for panic attacks.  Follow-up in 1 year or sooner with concerns.

## 2023-08-14 NOTE — Assessment & Plan Note (Signed)
Chronic, stable.  She is currently taking trazodone 25 to 50 mg daily as needed at bedtime.  We will continue this regimen.  Follow-up if symptoms worsen or with any concerns. 

## 2023-08-20 DIAGNOSIS — Z630 Problems in relationship with spouse or partner: Secondary | ICD-10-CM | POA: Diagnosis not present

## 2023-08-20 DIAGNOSIS — F4325 Adjustment disorder with mixed disturbance of emotions and conduct: Secondary | ICD-10-CM | POA: Diagnosis not present

## 2023-08-27 DIAGNOSIS — Z630 Problems in relationship with spouse or partner: Secondary | ICD-10-CM | POA: Diagnosis not present

## 2023-08-27 DIAGNOSIS — F4325 Adjustment disorder with mixed disturbance of emotions and conduct: Secondary | ICD-10-CM | POA: Diagnosis not present

## 2023-09-03 ENCOUNTER — Encounter: Payer: Self-pay | Admitting: Nurse Practitioner

## 2023-09-03 DIAGNOSIS — Z0184 Encounter for antibody response examination: Secondary | ICD-10-CM

## 2023-09-08 ENCOUNTER — Other Ambulatory Visit (INDEPENDENT_AMBULATORY_CARE_PROVIDER_SITE_OTHER)

## 2023-09-08 DIAGNOSIS — Z0184 Encounter for antibody response examination: Secondary | ICD-10-CM

## 2023-09-10 ENCOUNTER — Ambulatory Visit: Payer: Self-pay | Admitting: Nurse Practitioner

## 2023-09-10 LAB — MEASLES/MUMPS/RUBELLA IMMUNITY
Mumps IgG: 79.8 [AU]/ml
Rubella: 3.66 {index}
Rubeola IgG: 243 [AU]/ml

## 2023-09-25 DIAGNOSIS — Z630 Problems in relationship with spouse or partner: Secondary | ICD-10-CM | POA: Diagnosis not present

## 2023-09-25 DIAGNOSIS — F4325 Adjustment disorder with mixed disturbance of emotions and conduct: Secondary | ICD-10-CM | POA: Diagnosis not present

## 2023-11-06 ENCOUNTER — Other Ambulatory Visit: Payer: Self-pay | Admitting: Medical Genetics

## 2023-11-06 DIAGNOSIS — Z630 Problems in relationship with spouse or partner: Secondary | ICD-10-CM | POA: Diagnosis not present

## 2023-11-06 DIAGNOSIS — F4325 Adjustment disorder with mixed disturbance of emotions and conduct: Secondary | ICD-10-CM | POA: Diagnosis not present

## 2023-11-13 ENCOUNTER — Encounter: Payer: Self-pay | Admitting: Nurse Practitioner

## 2023-11-25 ENCOUNTER — Ambulatory Visit: Admitting: Adult Health

## 2023-11-25 ENCOUNTER — Encounter: Payer: Self-pay | Admitting: Adult Health

## 2023-11-25 ENCOUNTER — Other Ambulatory Visit (HOSPITAL_COMMUNITY)
Admission: RE | Admit: 2023-11-25 | Discharge: 2023-11-25 | Disposition: A | Discharge status: Home / Self Care | Source: Ambulatory Visit | Attending: Adult Health | Admitting: Adult Health

## 2023-11-25 VITALS — BP 99/63 | HR 75 | Ht 65.0 in | Wt 143.0 lb

## 2023-11-25 DIAGNOSIS — Z01419 Encounter for gynecological examination (general) (routine) without abnormal findings: Secondary | ICD-10-CM | POA: Insufficient documentation

## 2023-11-25 NOTE — Progress Notes (Signed)
 Patient ID: Heather Pruitt, female   DOB: 15-Aug-1985, 38 y.o.   MRN: [id number] History of Present Illness: Heather Pruitt is a 38 year old white female, with SO, G0P0, in for a well woman gyn exam and pap. She had general physical 08/14/23 with PCP  PCP is Heather Pruitt.   Current Medications, Allergies, Past Medical History, Past Surgical History, Family History and Social History were reviewed in [employer] record.     Review of Systems: Patient denies any problems with swallowing hearing loss, fatigue, blurred vision, shortness of breath, chest pain, abdominal pain, problems with bowel movements, urination, or intercourse. No joint pain or mood swings.  She does have headaches around periods, but Advil  and caffeine helps.  Periods are regular and not very heavy  Physical Exam:BP 99/63 (BP Location: Left Arm, Patient Position: Sitting, Cuff Size: Normal)   Pulse 75   Ht 5' 5 (1.651 m)   Wt 143 lb (64.9 kg)   LMP 11/18/2023 (Exact Date)   BMI 23.80 kg/m   General:  Well developed, well nourished, no acute distress Skin:  Warm and dry Neck:  Midline trachea, normal thyroid, good ROM, no lymphadenopathy Lungs; Clear to auscultation bilaterally Breast:  No dominant palpable mass, retraction, or nipple discharge Cardiovascular: Regular rate and rhythm Abdomen:  Soft, non tender, no hepatosplenomegaly Pelvic:  External genitalia is normal in appearance, no lesions.  The vagina is normal in appearance. Urethra has no lesions or masses. The cervix is smooth, pap with HR HPV genotyping performed.  Uterus is felt to be normal size, shape, and contour.  No adnexal masses or tenderness noted.Bladder is non tender, no masses felt. Rectal:Deferred  Extremities/musculoskeletal:  No swelling or varicosities noted, no clubbing or cyanosis Psych:  No mood changes, alert and cooperative,seems happy AA is 3 Fall risk is low    11/25/2023    9:34 AM 08/14/2023    8:42 AM 08/21/2022     8:49 AM  Depression screen PHQ 2/9  Decreased Interest 0 0 1  Down, Depressed, Hopeless 0 0 0  PHQ - 2 Score 0 0 1  Altered sleeping 0 0 0  Tired, decreased energy 1 1 1   Change in appetite 0 0 1  Feeling bad or failure about yourself  0 0 0  Trouble concentrating 1 1 1   Moving slowly or fidgety/restless 0 0 0  Suicidal thoughts 0 0 0  PHQ-9 Score 2 2 4   Difficult doing work/chores  Not difficult at all    She is on meds    11/25/2023    9:34 AM 08/21/2022    8:50 AM 08/13/2022   10:05 AM 09/12/2021   11:23 AM  GAD 7 : Generalized Anxiety Score  Nervous, Anxious, on Edge 1 0 0 1  Control/stop worrying 0 0 0 0  Worry too much - different things 0 0 0 0  Trouble relaxing 0 0 0 0  Restless 0 0 0 0  Easily annoyed or irritable 1 1 1 1   Afraid - awful might happen 0 0 0 0  Total GAD 7 Score 2 1 1 2   Anxiety Difficulty   Not difficult at all Not difficult at all      Upstream - 11/25/23 0932       Pregnancy Intention Screening   Does the patient want to become pregnant in the next year? No    Does the patient's partner want to become pregnant in the next year? No  Would the patient like to discuss contraceptive options today? No      Contraception Wrap Up   Current Method Female Sterilization    End Method Female Sterilization    Contraception Counseling Provided No          Examination chaperoned by Heather Salt LPN  Impression and plan: 1. Encounter for gynecological examination with Papanicolaou smear of cervix (Primary) Pap sent Pap in 3-5 years if normal Physical with PCP Labs with PCP   - Cytology - PAP( [place])

## 2023-12-02 LAB — CYTOLOGY - PAP
Comment: NEGATIVE
Comment: NEGATIVE
Comment: NEGATIVE
Diagnosis: NEGATIVE
Diagnosis: REACTIVE
HPV 16: NEGATIVE
HPV 18 / 45: NEGATIVE
High risk HPV: POSITIVE — AB

## 2023-12-03 ENCOUNTER — Ambulatory Visit: Payer: Self-pay | Admitting: Adult Health

## 2023-12-03 DIAGNOSIS — R87810 Cervical high risk human papillomavirus (HPV) DNA test positive: Secondary | ICD-10-CM | POA: Insufficient documentation

## 2023-12-30 ENCOUNTER — Other Ambulatory Visit

## 2023-12-30 DIAGNOSIS — Z006 Encounter for examination for normal comparison and control in clinical research program: Secondary | ICD-10-CM

## 2023-12-31 DIAGNOSIS — F4325 Adjustment disorder with mixed disturbance of emotions and conduct: Secondary | ICD-10-CM | POA: Diagnosis not present

## 2023-12-31 DIAGNOSIS — Z630 Problems in relationship with spouse or partner: Secondary | ICD-10-CM | POA: Diagnosis not present

## 2024-01-10 LAB — GENECONNECT MOLECULAR SCREEN

## 2024-01-13 ENCOUNTER — Ambulatory Visit: Payer: Self-pay

## 2024-01-16 ENCOUNTER — Encounter: Payer: Self-pay | Admitting: Nurse Practitioner

## 2024-01-20 ENCOUNTER — Other Ambulatory Visit: Payer: Self-pay | Admitting: Medical Genetics

## 2024-01-20 DIAGNOSIS — Z006 Encounter for examination for normal comparison and control in clinical research program: Secondary | ICD-10-CM

## 2024-01-29 DIAGNOSIS — Z630 Problems in relationship with spouse or partner: Secondary | ICD-10-CM | POA: Diagnosis not present

## 2024-01-29 DIAGNOSIS — F4325 Adjustment disorder with mixed disturbance of emotions and conduct: Secondary | ICD-10-CM | POA: Diagnosis not present

## 2024-08-16 ENCOUNTER — Encounter: Admitting: Nurse Practitioner
